# Patient Record
Sex: Male | Born: 1935 | Race: White | Hispanic: No | Smoking: Current some day smoker
Health system: Southern US, Community
[De-identification: ages and names within clinical notes are randomized; demographics above are authoritative.]

## PROBLEM LIST (undated history)

## (undated) DIAGNOSIS — I2510 Atherosclerotic heart disease of native coronary artery without angina pectoris: Secondary | ICD-10-CM

## (undated) DIAGNOSIS — F32A Depression, unspecified: Secondary | ICD-10-CM

## (undated) DIAGNOSIS — Z9289 Personal history of other medical treatment: Secondary | ICD-10-CM

## (undated) DIAGNOSIS — F4310 Post-traumatic stress disorder, unspecified: Secondary | ICD-10-CM

## (undated) DIAGNOSIS — D649 Anemia, unspecified: Secondary | ICD-10-CM

## (undated) DIAGNOSIS — I10 Essential (primary) hypertension: Secondary | ICD-10-CM

## (undated) DIAGNOSIS — M1990 Unspecified osteoarthritis, unspecified site: Secondary | ICD-10-CM

## (undated) DIAGNOSIS — F329 Major depressive disorder, single episode, unspecified: Secondary | ICD-10-CM

## (undated) DIAGNOSIS — G4733 Obstructive sleep apnea (adult) (pediatric): Secondary | ICD-10-CM

## (undated) DIAGNOSIS — K219 Gastro-esophageal reflux disease without esophagitis: Secondary | ICD-10-CM

## (undated) DIAGNOSIS — K649 Unspecified hemorrhoids: Secondary | ICD-10-CM

## (undated) DIAGNOSIS — I4891 Unspecified atrial fibrillation: Secondary | ICD-10-CM

## (undated) DIAGNOSIS — N183 Chronic kidney disease, stage 3 unspecified: Secondary | ICD-10-CM

## (undated) DIAGNOSIS — E7800 Pure hypercholesterolemia, unspecified: Secondary | ICD-10-CM

## (undated) DIAGNOSIS — Z9989 Dependence on other enabling machines and devices: Secondary | ICD-10-CM

## (undated) DIAGNOSIS — I319 Disease of pericardium, unspecified: Secondary | ICD-10-CM

## (undated) DIAGNOSIS — F419 Anxiety disorder, unspecified: Secondary | ICD-10-CM

## (undated) DIAGNOSIS — I4892 Unspecified atrial flutter: Secondary | ICD-10-CM

## (undated) DIAGNOSIS — F319 Bipolar disorder, unspecified: Secondary | ICD-10-CM

## (undated) DIAGNOSIS — E039 Hypothyroidism, unspecified: Secondary | ICD-10-CM

## (undated) DIAGNOSIS — Z8719 Personal history of other diseases of the digestive system: Secondary | ICD-10-CM

## (undated) HISTORY — DX: Depression, unspecified: F32.A

## (undated) HISTORY — DX: Atherosclerotic heart disease of native coronary artery without angina pectoris: I25.10

## (undated) HISTORY — DX: Major depressive disorder, single episode, unspecified: F32.9

## (undated) HISTORY — DX: Bipolar disorder, unspecified: F31.9

## (undated) HISTORY — PX: EYE SURGERY: SHX253

## (undated) HISTORY — PX: TOTAL KNEE ARTHROPLASTY: SHX125

## (undated) HISTORY — DX: Unspecified atrial fibrillation: I48.91

## (undated) HISTORY — PX: COLONOSCOPY: SHX174

## (undated) HISTORY — PX: CARDIAC CATHETERIZATION: SHX172

## (undated) HISTORY — DX: Unspecified osteoarthritis, unspecified site: M19.90

## (undated) HISTORY — DX: Hypothyroidism, unspecified: E03.9

## (undated) HISTORY — DX: Disease of pericardium, unspecified: I31.9

## (undated) HISTORY — DX: Essential (primary) hypertension: I10

## (undated) HISTORY — DX: Unspecified atrial flutter: I48.92

## (undated) HISTORY — DX: Gastro-esophageal reflux disease without esophagitis: K21.9

---

## 1989-05-21 HISTORY — PX: KNEE ARTHROSCOPY: SHX127

## 2009-09-09 ENCOUNTER — Encounter: Admission: RE | Admit: 2009-09-09 | Discharge: 2009-09-09 | Payer: Self-pay | Admitting: Nephrology

## 2009-12-15 ENCOUNTER — Inpatient Hospital Stay (HOSPITAL_COMMUNITY): Admission: RE | Admit: 2009-12-15 | Discharge: 2009-12-25 | Payer: Self-pay | Admitting: Orthopedic Surgery

## 2009-12-21 ENCOUNTER — Ambulatory Visit: Payer: Self-pay | Admitting: Hematology and Oncology

## 2009-12-30 ENCOUNTER — Ambulatory Visit: Payer: Self-pay | Admitting: Hematology and Oncology

## 2010-02-03 ENCOUNTER — Ambulatory Visit: Payer: Self-pay | Admitting: Hematology and Oncology

## 2010-02-04 LAB — PROTHROMBIN TIME: INR: 2.97 — ABNORMAL HIGH (ref <1.50)

## 2010-02-04 LAB — CBC WITH DIFFERENTIAL/PLATELET
BASO%: 0.6 % (ref 0.0–2.0)
HCT: 36.1 % — ABNORMAL LOW (ref 38.4–49.9)
MCV: 94.6 fL (ref 79.3–98.0)
NEUT#: 3.9 10*3/uL (ref 1.5–6.5)
NEUT%: 65.2 % (ref 39.0–75.0)
Platelets: 201 10*3/uL (ref 140–400)
WBC: 5.9 10*3/uL (ref 4.0–10.3)

## 2010-02-11 LAB — FACTOR 11 ASSAY: Factor XI Activity: 91 % (ref 65–150)

## 2010-02-11 LAB — FACTOR 9 ASSAY: Coagulation Factor IX: 31 % — ABNORMAL LOW (ref 75–134)

## 2010-02-11 LAB — LUPUS ANTICOAGULANT PANEL
DRVVT 1:1 Mix: 44.2 secs (ref 36.2–44.3)
DRVVT: 78.8 secs — ABNORMAL HIGH (ref 36.2–44.3)
PTT Lupus Anticoagulant: 80.4 secs — ABNORMAL HIGH (ref 32.0–43.4)
PTTLA 4:1 Mix: 50.6 secs — ABNORMAL HIGH (ref 36.3–48.8)

## 2010-02-11 LAB — FACTOR 8 INHIBITOR: Factor VIII: C, Activity: 266 % — ABNORMAL HIGH (ref 56–191)

## 2010-02-11 LAB — FACTOR 2 ASSAY: Factor II Activity: 22 % — ABNORMAL LOW (ref 74–131)

## 2010-12-09 LAB — CROSSMATCH

## 2010-12-09 LAB — HEPATIC FUNCTION PANEL
ALT: 42 U/L (ref 0–53)
AST: 50 U/L — ABNORMAL HIGH (ref 0–37)
Alkaline Phosphatase: 57 U/L (ref 39–117)
Bilirubin, Direct: 0.2 mg/dL (ref 0.0–0.3)

## 2010-12-09 LAB — BASIC METABOLIC PANEL
BUN: 31 mg/dL — ABNORMAL HIGH (ref 6–23)
BUN: 32 mg/dL — ABNORMAL HIGH (ref 6–23)
BUN: 38 mg/dL — ABNORMAL HIGH (ref 6–23)
CO2: 24 mEq/L (ref 19–32)
Calcium: 8.3 mg/dL — ABNORMAL LOW (ref 8.4–10.5)
Calcium: 8.7 mg/dL (ref 8.4–10.5)
Chloride: 107 mEq/L (ref 96–112)
Creatinine, Ser: 2.03 mg/dL — ABNORMAL HIGH (ref 0.4–1.5)
Creatinine, Ser: 2.31 mg/dL — ABNORMAL HIGH (ref 0.4–1.5)
GFR calc Af Amer: 28 mL/min — ABNORMAL LOW (ref >60)
GFR calc Af Amer: 33 mL/min — ABNORMAL LOW (ref >60)
GFR calc Af Amer: 37 mL/min — ABNORMAL LOW (ref >60)
GFR calc Af Amer: 37 mL/min — ABNORMAL LOW (ref >60)
GFR calc non Af Amer: 23 mL/min — ABNORMAL LOW (ref >60)
GFR calc non Af Amer: 30 mL/min — ABNORMAL LOW (ref >60)
GFR calc non Af Amer: 31 mL/min — ABNORMAL LOW (ref >60)
GFR calc non Af Amer: 32 mL/min — ABNORMAL LOW (ref >60)
Glucose, Bld: 100 mg/dL — ABNORMAL HIGH (ref 70–99)
Potassium: 4.8 mEq/L (ref 3.5–5.1)
Potassium: 4.8 mEq/L (ref 3.5–5.1)
Sodium: 138 mEq/L (ref 135–145)
Sodium: 138 mEq/L (ref 135–145)
Sodium: 144 mEq/L (ref 135–145)

## 2010-12-09 LAB — ANTIPHOSPHOLIPID SYNDROME EVAL, BLD
Anticardiolipin IgG: 2 GPL U/mL — ABNORMAL LOW (ref <23)
PTT Lupus Anticoagulant: 70.3 secs — ABNORMAL HIGH (ref 32.0–43.4)
PTTLA 4:1 Mix: 598.9 secs — ABNORMAL HIGH (ref 36.3–48.8)
PTTLA Confirmation: 21.3 secs — ABNORMAL HIGH (ref <8.0)

## 2010-12-09 LAB — CBC
HCT: 27.5 % — ABNORMAL LOW (ref 39.0–52.0)
HCT: 30 % — ABNORMAL LOW (ref 39.0–52.0)
HCT: 30.7 % — ABNORMAL LOW (ref 39.0–52.0)
Hemoglobin: 10.2 g/dL — ABNORMAL LOW (ref 13.0–17.0)
Hemoglobin: 8.1 g/dL — ABNORMAL LOW (ref 13.0–17.0)
Hemoglobin: 9.6 g/dL — ABNORMAL LOW (ref 13.0–17.0)
MCHC: 33.4 g/dL (ref 30.0–36.0)
MCHC: 33.7 g/dL (ref 30.0–36.0)
MCV: 94.3 fL (ref 78.0–100.0)
MCV: 95.8 fL (ref 78.0–100.0)
MCV: 96.3 fL (ref 78.0–100.0)
Platelets: 166 10*3/uL (ref 150–400)
Platelets: 196 10*3/uL (ref 150–400)
Platelets: 264 10*3/uL (ref 150–400)
RBC: 2.49 MIL/uL — ABNORMAL LOW (ref 4.22–5.81)
RBC: 2.62 MIL/uL — ABNORMAL LOW (ref 4.22–5.81)
RBC: 2.85 MIL/uL — ABNORMAL LOW (ref 4.22–5.81)
RBC: 3.06 MIL/uL — ABNORMAL LOW (ref 4.22–5.81)
RBC: 3.2 MIL/uL — ABNORMAL LOW (ref 4.22–5.81)
RDW: 14.8 % (ref 11.5–15.5)
RDW: 14.9 % (ref 11.5–15.5)
RDW: 15.4 % (ref 11.5–15.5)
RDW: 15.8 % — ABNORMAL HIGH (ref 11.5–15.5)
WBC: 7.9 10*3/uL (ref 4.0–10.5)
WBC: 8.3 10*3/uL (ref 4.0–10.5)
WBC: 9.3 10*3/uL (ref 4.0–10.5)
WBC: 9.5 10*3/uL (ref 4.0–10.5)

## 2010-12-09 LAB — URINE CULTURE

## 2010-12-09 LAB — URINALYSIS, MICROSCOPIC ONLY
Bilirubin Urine: NEGATIVE
Specific Gravity, Urine: 1.008 (ref 1.005–1.030)
pH: 8 (ref 5.0–8.0)

## 2010-12-09 LAB — PROTIME-INR
INR: 1.2 (ref 0.00–1.49)
INR: 1.22 (ref 0.00–1.49)
INR: 1.29 (ref 0.00–1.49)
INR: 1.34 (ref 0.00–1.49)
INR: 2.16 — ABNORMAL HIGH (ref 0.00–1.49)
INR: 2.18 — ABNORMAL HIGH (ref 0.00–1.49)
Prothrombin Time: 16 seconds — ABNORMAL HIGH (ref 11.6–15.2)
Prothrombin Time: 22.4 seconds — ABNORMAL HIGH (ref 11.6–15.2)
Prothrombin Time: 23.9 seconds — ABNORMAL HIGH (ref 11.6–15.2)

## 2010-12-09 LAB — PREPARE FRESH FROZEN PLASMA

## 2010-12-09 LAB — VON WILLEBRAND PANEL
Factor-VIII Activity: 165 % — ABNORMAL HIGH (ref 50–150)
Ristocetin-Cofactor: >150 % (ref 50–150)

## 2010-12-09 LAB — APTT
aPTT: 46 seconds — ABNORMAL HIGH (ref 24–37)
aPTT: 66 seconds — ABNORMAL HIGH (ref 24–37)

## 2010-12-09 LAB — THROMBIN TIME: Thrombin Time: 17 (ref 16–23)

## 2010-12-09 LAB — PT FACTOR INHIBITOR (MIXING STUDY)
Patient-1/1, Immediate Mix-PT: NORMAL seconds
Protime: 14.5 seconds (ref 11.6–15.2)

## 2010-12-09 LAB — PTT FACTOR INHIBITOR (MIXING STUDY): PTT: 42 seconds — ABNORMAL HIGH (ref 24–37)

## 2010-12-09 LAB — FACTOR 11 ASSAY
Factor XI Activity: 104 % (ref 65–150)
Factor XI Activity: 88 % (ref 65–150)

## 2010-12-09 LAB — FIBRINOGEN: Fibrinogen: >800 mg/dL — ABNORMAL HIGH (ref 204–475)

## 2010-12-14 LAB — CBC
HCT: 27.5 % — ABNORMAL LOW (ref 39.0–52.0)
HCT: 32 % — ABNORMAL LOW (ref 39.0–52.0)
Hemoglobin: 10.7 g/dL — ABNORMAL LOW (ref 13.0–17.0)
Hemoglobin: 8 g/dL — ABNORMAL LOW (ref 13.0–17.0)
Hemoglobin: 9.1 g/dL — ABNORMAL LOW (ref 13.0–17.0)
MCHC: 33 g/dL (ref 30.0–36.0)
MCHC: 33.1 g/dL (ref 30.0–36.0)
MCHC: 33.4 g/dL (ref 30.0–36.0)
MCHC: 33.5 g/dL (ref 30.0–36.0)
MCV: 96.7 fL (ref 78.0–100.0)
MCV: 97.1 fL (ref 78.0–100.0)
MCV: 97.8 fL (ref 78.0–100.0)
Platelets: 143 10*3/uL — ABNORMAL LOW (ref 150–400)
Platelets: 157 10*3/uL (ref 150–400)
Platelets: 192 10*3/uL (ref 150–400)
RBC: 3.31 MIL/uL — ABNORMAL LOW (ref 4.22–5.81)
RBC: 3.66 MIL/uL — ABNORMAL LOW (ref 4.22–5.81)
RDW: 13.5 % (ref 11.5–15.5)
RDW: 13.9 % (ref 11.5–15.5)
RDW: 14.3 % (ref 11.5–15.5)
WBC: 9.6 10*3/uL (ref 4.0–10.5)

## 2010-12-14 LAB — COMPREHENSIVE METABOLIC PANEL
AST: 22 U/L (ref 0–37)
BUN: 39 mg/dL — ABNORMAL HIGH (ref 6–23)
CO2: 23 mEq/L (ref 19–32)
Calcium: 8.2 mg/dL — ABNORMAL LOW (ref 8.4–10.5)
Creatinine, Ser: 2.67 mg/dL — ABNORMAL HIGH (ref 0.4–1.5)
GFR calc Af Amer: 28 mL/min — ABNORMAL LOW (ref >60)
GFR calc non Af Amer: 23 mL/min — ABNORMAL LOW (ref >60)
Glucose, Bld: 85 mg/dL (ref 70–99)

## 2010-12-14 LAB — BASIC METABOLIC PANEL
BUN: 35 mg/dL — ABNORMAL HIGH (ref 6–23)
BUN: 37 mg/dL — ABNORMAL HIGH (ref 6–23)
BUN: 38 mg/dL — ABNORMAL HIGH (ref 6–23)
BUN: 38 mg/dL — ABNORMAL HIGH (ref 6–23)
BUN: 39 mg/dL — ABNORMAL HIGH (ref 6–23)
CO2: 25 mEq/L (ref 19–32)
CO2: 25 mEq/L (ref 19–32)
CO2: 25 mEq/L (ref 19–32)
CO2: 26 mEq/L (ref 19–32)
CO2: 27 mEq/L (ref 19–32)
CO2: 27 mEq/L (ref 19–32)
Calcium: 7.6 mg/dL — ABNORMAL LOW (ref 8.4–10.5)
Calcium: 7.7 mg/dL — ABNORMAL LOW (ref 8.4–10.5)
Calcium: 7.9 mg/dL — ABNORMAL LOW (ref 8.4–10.5)
Calcium: 8.1 mg/dL — ABNORMAL LOW (ref 8.4–10.5)
Chloride: 102 mEq/L (ref 96–112)
Chloride: 105 mEq/L (ref 96–112)
Chloride: 107 mEq/L (ref 96–112)
Chloride: 107 mEq/L (ref 96–112)
Chloride: 108 mEq/L (ref 96–112)
Creatinine, Ser: 2.78 mg/dL — ABNORMAL HIGH (ref 0.4–1.5)
Creatinine, Ser: 2.82 mg/dL — ABNORMAL HIGH (ref 0.4–1.5)
Creatinine, Ser: 3.2 mg/dL — ABNORMAL HIGH (ref 0.4–1.5)
Creatinine, Ser: 3.28 mg/dL — ABNORMAL HIGH (ref 0.4–1.5)
GFR calc Af Amer: 22 mL/min — ABNORMAL LOW (ref >60)
GFR calc Af Amer: 23 mL/min — ABNORMAL LOW (ref >60)
GFR calc Af Amer: 27 mL/min — ABNORMAL LOW (ref >60)
GFR calc non Af Amer: 18 mL/min — ABNORMAL LOW (ref >60)
GFR calc non Af Amer: 19 mL/min — ABNORMAL LOW (ref >60)
GFR calc non Af Amer: 22 mL/min — ABNORMAL LOW (ref >60)
GFR calc non Af Amer: 22 mL/min — ABNORMAL LOW (ref >60)
Glucose, Bld: 108 mg/dL — ABNORMAL HIGH (ref 70–99)
Glucose, Bld: 117 mg/dL — ABNORMAL HIGH (ref 70–99)
Glucose, Bld: 124 mg/dL — ABNORMAL HIGH (ref 70–99)
Glucose, Bld: 127 mg/dL — ABNORMAL HIGH (ref 70–99)
Glucose, Bld: 130 mg/dL — ABNORMAL HIGH (ref 70–99)
Glucose, Bld: 99 mg/dL (ref 70–99)
Potassium: 4.4 mEq/L (ref 3.5–5.1)
Potassium: 4.7 mEq/L (ref 3.5–5.1)
Potassium: 5 mEq/L (ref 3.5–5.1)
Potassium: 6.1 mEq/L — ABNORMAL HIGH (ref 3.5–5.1)
Potassium: 6.7 mEq/L (ref 3.5–5.1)
Sodium: 131 mEq/L — ABNORMAL LOW (ref 135–145)
Sodium: 136 mEq/L (ref 135–145)
Sodium: 137 mEq/L (ref 135–145)
Sodium: 137 mEq/L (ref 135–145)
Sodium: 138 mEq/L (ref 135–145)

## 2010-12-14 LAB — CROSSMATCH
ABO/RH(D): A POS
Antibody Screen: NEGATIVE

## 2010-12-14 LAB — URINALYSIS, ROUTINE W REFLEX MICROSCOPIC
Bilirubin Urine: NEGATIVE
Hgb urine dipstick: NEGATIVE
Specific Gravity, Urine: 1.01 (ref 1.005–1.030)
Urobilinogen, UA: 0.2 mg/dL (ref 0.0–1.0)

## 2010-12-14 LAB — PROTIME-INR
INR: 1.23 (ref 0.00–1.49)
INR: 1.61 — ABNORMAL HIGH (ref 0.00–1.49)
INR: 1.86 — ABNORMAL HIGH (ref 0.00–1.49)
INR: 3.09 — ABNORMAL HIGH (ref 0.00–1.49)
Prothrombin Time: 15.4 seconds — ABNORMAL HIGH (ref 11.6–15.2)
Prothrombin Time: 19 seconds — ABNORMAL HIGH (ref 11.6–15.2)
Prothrombin Time: 31.6 seconds — ABNORMAL HIGH (ref 11.6–15.2)

## 2010-12-14 LAB — APTT
aPTT: 28 seconds (ref 24–37)
aPTT: 58 seconds — ABNORMAL HIGH (ref 24–37)

## 2010-12-14 LAB — ABO/RH: ABO/RH(D): A POS

## 2011-08-10 ENCOUNTER — Encounter: Payer: Self-pay | Admitting: Internal Medicine

## 2011-08-27 ENCOUNTER — Encounter: Payer: Self-pay | Admitting: Internal Medicine

## 2011-09-20 ENCOUNTER — Encounter: Payer: Self-pay | Admitting: Internal Medicine

## 2011-09-20 ENCOUNTER — Telehealth: Payer: Self-pay | Admitting: Physician Assistant

## 2011-09-20 ENCOUNTER — Ambulatory Visit (INDEPENDENT_AMBULATORY_CARE_PROVIDER_SITE_OTHER): Payer: Medicare Other | Admitting: Internal Medicine

## 2011-09-20 ENCOUNTER — Encounter: Payer: Self-pay | Admitting: *Deleted

## 2011-09-20 VITALS — BP 128/86 | HR 106 | Ht 71.5 in | Wt 210.1 lb

## 2011-09-20 DIAGNOSIS — I4891 Unspecified atrial fibrillation: Secondary | ICD-10-CM

## 2011-09-20 DIAGNOSIS — I4892 Unspecified atrial flutter: Secondary | ICD-10-CM

## 2011-09-20 DIAGNOSIS — I10 Essential (primary) hypertension: Secondary | ICD-10-CM

## 2011-09-20 LAB — CBC WITH DIFFERENTIAL/PLATELET
Basophils Absolute: 0 10*3/uL (ref 0.0–0.1)
Basophils Relative: 1 % (ref 0–1)
Hemoglobin: 11.8 g/dL — ABNORMAL LOW (ref 13.0–17.0)
MCHC: 32 g/dL (ref 30.0–36.0)
Monocytes Relative: 9 % (ref 3–12)
Neutro Abs: 3.7 10*3/uL (ref 1.7–7.7)
Neutrophils Relative %: 60 % (ref 43–77)

## 2011-09-20 LAB — BASIC METABOLIC PANEL WITH GFR
BUN: 55 mg/dL — ABNORMAL HIGH (ref 6–23)
CO2: 25 meq/L (ref 19–32)
Calcium: 9.2 mg/dL (ref 8.4–10.5)
Chloride: 103 meq/L (ref 96–112)
Creat: 2.91 mg/dL — ABNORMAL HIGH (ref 0.50–1.35)
Glucose, Bld: 90 mg/dL (ref 70–99)
Potassium: 4.8 meq/L (ref 3.5–5.3)
Sodium: 137 meq/L (ref 135–145)

## 2011-09-20 LAB — PROTIME-INR
INR: 2.62 — ABNORMAL HIGH (ref <1.50)
Prothrombin Time: 28.9 s — ABNORMAL HIGH (ref 11.6–15.2)

## 2011-09-20 NOTE — Progress Notes (Signed)
Primary Care Physician: Pearla Dubonnet, MD, MD Referring Physician:  Dr Kevan Ny Dibella is a 77 y.o. male with a h/o atrial fibrillation and atrial flutter who presents today for EP consultation.  He reports that he was initially diagnosed with atrial fibrillation 2007.  He does not recall the specifics of his symptoms at that time.  He required cardioversion at that time.  Since that time, he notices episodes of afib when having episodes of GERD.  He was initiated on propafenone 4-5 years ago.  He is not certain as to what his afib burden was on propafenone.  He has noticed that over the past few months, he has had decreased exercise tolerance.  He ws evaluated by Dr Abe People and found to have atrial flutter with RVR.  He presently feels that he has "good days and bad days".  Upon being evaluated by Dr Eldridge Dace, His Coreg was increased at that time.  He has been adequately anticoagulated with coumadin.  Today, he denies symptoms of palpitations, chest pain, shortness of breath, orthopnea, PND, lower extremity edema, dizziness, presyncope, syncope, or neurologic sequela. The patient is tolerating medications without difficulties and is otherwise without complaint today.   Past Medical History  Diagnosis Date  . Campath-induced atrial fibrillation   . Atrial flutter   . Chronic renal failure   . GERD (gastroesophageal reflux disease)   . Hypertension   . Hypothyroidism   . DJD (degenerative joint disease)     s/p L TKR 12/15/09  . Hyperparathyroidism   . Bipolar disorder   . Obstructive sleep apnea     compliant with CPAP  . Coronary artery disease     mild, 60% stenosis of prox LAD per CTA 2007  . Depression   . Pericarditis     age 54   Past Surgical History  Procedure Date  . Bilateral total knee arthoplasty     Current Outpatient Prescriptions  Medication Sig Dispense Refill  . amLODipine (NORVASC) 5 MG tablet Take 5 mg by mouth daily.        . calcitRIOL (ROCALTROL)  0.25 MCG capsule Take 0.25 mcg by mouth daily.        . carvedilol (COREG) 12.5 MG tablet Take 25 mg by mouth 2 (two) times daily with a meal.        . fish oil-omega-3 fatty acids 1000 MG capsule Take 1 g by mouth daily.        . furosemide (LASIX) 40 MG tablet Take 40 mg by mouth daily.        Marland Kitchen lamoTRIgine (LAMICTAL) 200 MG tablet Take 200 mg by mouth 2 (two) times daily.        Marland Kitchen levothyroxine (SYNTHROID, LEVOTHROID) 112 MCG tablet Take 112 mcg by mouth daily.        Marland Kitchen omeprazole (PRILOSEC) 20 MG capsule Take 20 mg by mouth daily.        . propafenone (RYTHMOL) 225 MG tablet Take 225 mg by mouth 2 (two) times daily.        . simvastatin (ZOCOR) 20 MG tablet Take 20 mg by mouth at bedtime.        . tadalafil (CIALIS) 5 MG tablet Take 5 mg by mouth daily as needed.        . warfarin (COUMADIN) 5 MG tablet Take 5 mg by mouth daily.          No Known Allergies  History   Social History  . Marital Status:  Single    Spouse Name: N/A    Number of Children: N/A  . Years of Education: N/A   Occupational History  . Not on file.   Social History Main Topics  . Smoking status: Former Games developer  . Smokeless tobacco: Not on file   Comment: quit x 25 years  . Alcohol Use: Yes     1-2 glasses of red wine several times per week  . Drug Use: No  . Sexually Active: Not on file   Other Topics Concern  . Not on file   Social History Narrative   Pt lives in [place].  Retired.  Previously worked as a Warden/ranger    FH: adopted  ROS- All systems are reviewed and negative except as per the HPI above  Physical Exam: Filed Vitals:   09/20/11 1222  BP: 128/86  Pulse: 106  Height: 5' 11.5" (1.816 m)  Weight: 210 lb 1.9 oz (95.31 kg)    GEN- The patient is well appearing, alert and oriented x 3 today.   Head- normocephalic, atraumatic Eyes-  Sclera clear, conjunctiva pink Ears- hearing intact Oropharynx- clear Neck- supple, no JVP Lymph- no cervical  lymphadenopathy Lungs- Clear to ausculation bilaterally, normal work of breathing Heart- tachycadic irregular rhythm, no murmurs, rubs or gallops, PMI not laterally displaced GI- soft, NT, ND, + BS Extremities- no clubbing, cyanosis, or edema MS- no significant deformity or atrophy Skin- no rash or lesion Psych- euthymic mood, full affect Neuro- strength and sensation are intact  EKG today reveals atrial flutter (likely typical) with RBBB, LAHB QRS morphology  Assessment and Plan:

## 2011-09-20 NOTE — Telephone Encounter (Signed)
Stat labs from this evening called in at 8:30pm. Cr slightly higher than usual but has varied 2.03-2.8 over the last several results-- will forward this message to MD to ensure they are reviewed.

## 2011-09-20 NOTE — Patient Instructions (Signed)
Your physician has recommended that you have an ablation. Catheter ablation is a medical procedure used to treat some cardiac arrhythmias (irregular heartbeats). During catheter ablation, a long, thin, flexible tube is put into a blood vessel in your groin (upper thigh), or neck. This tube is called an ablation catheter. It is then guided to your heart through the blood vessel. Radio frequency waves destroy small areas of heart tissue where abnormal heartbeats may cause an arrhythmia to start. Please see the instruction sheet given to you today.     Your physician has requested that you have an echocardiogram. Echocardiography is a painless test that uses sound waves to create images of your heart. It provides your doctor with information about the size and shape of your heart and how well your heart's chambers and valves are working. This procedure takes approximately one hour. There are no restrictions for this procedure.---At  Dr Hoyle Barr office

## 2011-09-21 DIAGNOSIS — I10 Essential (primary) hypertension: Secondary | ICD-10-CM | POA: Insufficient documentation

## 2011-09-21 DIAGNOSIS — I4891 Unspecified atrial fibrillation: Secondary | ICD-10-CM | POA: Insufficient documentation

## 2011-09-21 DIAGNOSIS — I4892 Unspecified atrial flutter: Secondary | ICD-10-CM | POA: Insufficient documentation

## 2011-09-21 NOTE — Assessment & Plan Note (Signed)
The patient presents today with symptomatic atrial flutter.  He has 2:1 AV conduction with underlying RBBB/LAHB QRS pattern. Therapeutic strategies for this tachycardia including medicine and ablation were discussed in detail with the patient today. Risk, benefits, and alternatives to EP study and radiofrequency ablation were also discussed in detail today. These risks include but are not limited to stroke, bleeding, vascular damage, tamponade, perforation, damage to the heart and other structures, AV block requiring pacemaker, worsening renal function, and death. The patient understands these risk and wishes to proceed.  We will therefore proceed with catheter ablation at the next available time.  He is chronically anticoagulated with coumadin and has been recently therapeutic. No changes are made today.

## 2011-09-21 NOTE — Assessment & Plan Note (Signed)
Stable No change required today  

## 2011-09-21 NOTE — Assessment & Plan Note (Signed)
The patient has afib and atrial flutter (see above).  He is presently symptomatic with persistent atrial flutter.  He feels that his afib has been controlled with Rhythmol in the past.  Given his renal failure, I would not advise afib ablation at this time (which would require contrast).  I would favor atrial flutter ablation as above (if right atrial flutter exists).  We will manage atrial fibrillation with AAD long term. I will ask Dr Eldridge Dace to obtain an echo to evaluate left atrial size and exclude structural heart disease. He will require life long anticoagulation with coumadin.  Given long standing renal failure, he would not be a candidate for a novel anticoagulation at this time.

## 2011-09-22 ENCOUNTER — Encounter (HOSPITAL_COMMUNITY): Payer: Self-pay | Admitting: Pharmacy Technician

## 2011-09-28 ENCOUNTER — Ambulatory Visit (HOSPITAL_COMMUNITY): Admit: 2011-09-28 | Payer: Medicare Other | Admitting: Internal Medicine

## 2011-09-28 ENCOUNTER — Encounter (HOSPITAL_COMMUNITY): Payer: Self-pay

## 2011-09-28 ENCOUNTER — Ambulatory Visit (HOSPITAL_COMMUNITY)
Admission: RE | Admit: 2011-09-28 | Discharge: 2011-09-28 | Disposition: A | Discharge status: Home / Self Care | Payer: Medicare Other | Source: Ambulatory Visit | Attending: Internal Medicine | Admitting: Internal Medicine

## 2011-09-28 ENCOUNTER — Encounter (HOSPITAL_COMMUNITY): Payer: Self-pay | Admitting: *Deleted

## 2011-09-28 ENCOUNTER — Encounter (HOSPITAL_COMMUNITY)
Admission: RE | Disposition: A | Discharge status: Home / Self Care | Payer: Self-pay | Source: Ambulatory Visit | Attending: Internal Medicine

## 2011-09-28 ENCOUNTER — Ambulatory Visit (HOSPITAL_COMMUNITY): Payer: Medicare Other

## 2011-09-28 DIAGNOSIS — F319 Bipolar disorder, unspecified: Secondary | ICD-10-CM | POA: Insufficient documentation

## 2011-09-28 DIAGNOSIS — I2510 Atherosclerotic heart disease of native coronary artery without angina pectoris: Secondary | ICD-10-CM | POA: Insufficient documentation

## 2011-09-28 DIAGNOSIS — I129 Hypertensive chronic kidney disease with stage 1 through stage 4 chronic kidney disease, or unspecified chronic kidney disease: Secondary | ICD-10-CM | POA: Insufficient documentation

## 2011-09-28 DIAGNOSIS — I4892 Unspecified atrial flutter: Secondary | ICD-10-CM

## 2011-09-28 DIAGNOSIS — Z96659 Presence of unspecified artificial knee joint: Secondary | ICD-10-CM | POA: Insufficient documentation

## 2011-09-28 DIAGNOSIS — N189 Chronic kidney disease, unspecified: Secondary | ICD-10-CM | POA: Insufficient documentation

## 2011-09-28 DIAGNOSIS — E039 Hypothyroidism, unspecified: Secondary | ICD-10-CM | POA: Insufficient documentation

## 2011-09-28 DIAGNOSIS — K219 Gastro-esophageal reflux disease without esophagitis: Secondary | ICD-10-CM | POA: Insufficient documentation

## 2011-09-28 DIAGNOSIS — M1990 Unspecified osteoarthritis, unspecified site: Secondary | ICD-10-CM | POA: Insufficient documentation

## 2011-09-28 HISTORY — PX: OTHER SURGICAL HISTORY: SHX169

## 2011-09-28 HISTORY — PX: ATRIAL FLUTTER ABLATION: SHX5733

## 2011-09-28 LAB — PROTIME-INR
INR: 2.9 — ABNORMAL HIGH (ref 0.00–1.49)
Prothrombin Time: 30.8 seconds — ABNORMAL HIGH (ref 11.6–15.2)

## 2011-09-28 SURGERY — ATRIAL FLUTTER ABLATION
Anesthesia: General

## 2011-09-28 MED ORDER — ONDANSETRON HCL 4 MG/2ML IJ SOLN
INTRAMUSCULAR | Status: DC | PRN
  Administered 2011-09-28: 4 mg via INTRAVENOUS

## 2011-09-28 MED ORDER — PROPOFOL 10 MG/ML IV EMUL
INTRAVENOUS | Status: DC | PRN
  Administered 2011-09-28: 25 ug/kg/min via INTRAVENOUS

## 2011-09-28 MED ORDER — BUPIVACAINE HCL (PF) 0.25 % IJ SOLN
INTRAMUSCULAR | Status: AC
  Filled 2011-09-28: qty 30

## 2011-09-28 MED ORDER — SODIUM CHLORIDE 0.9 % IV SOLN: INTRAVENOUS | Status: DC

## 2011-09-28 MED ORDER — SODIUM CHLORIDE 0.9 % IJ SOLN: 3.0000 mL | Freq: Two times a day (BID) | INTRAMUSCULAR | Status: DC

## 2011-09-28 MED ORDER — LACTATED RINGERS IV SOLN
INTRAVENOUS | Status: DC | PRN
  Administered 2011-09-28: 07:00:00 via INTRAVENOUS

## 2011-09-28 MED ORDER — FENTANYL CITRATE 0.05 MG/ML IJ SOLN
INTRAMUSCULAR | Status: DC | PRN
  Administered 2011-09-28: 50 ug via INTRAVENOUS
  Administered 2011-09-28: 25 ug via INTRAVENOUS
  Administered 2011-09-28: 50 ug via INTRAVENOUS
  Administered 2011-09-28: 25 ug via INTRAVENOUS

## 2011-09-28 MED ORDER — SODIUM CHLORIDE 0.9 % IV SOLN: 250.0000 mL | INTRAVENOUS | Status: DC | PRN

## 2011-09-28 MED ORDER — MIDAZOLAM HCL 5 MG/5ML IJ SOLN
INTRAMUSCULAR | Status: DC | PRN
  Administered 2011-09-28: 2 mg via INTRAVENOUS

## 2011-09-28 MED ORDER — SODIUM CHLORIDE 0.9 % IJ SOLN: 3.0000 mL | INTRAMUSCULAR | Status: DC | PRN

## 2011-09-28 NOTE — Interval H&P Note (Signed)
History and Physical Interval Note:  09/28/2011 7:18 AM  Tim Ponce  has presented today for surgery, with the diagnosis of A Flutter  The various methods of treatment have been discussed with the patient and family. After consideration of risks, benefits and other options for treatment, the patient has consented to  Procedure(s): ATRIAL FLUTTER ABLATION as a surgical intervention .  The patients' history has been reviewed, patient examined, no change in status, stable for surgery.  I have reviewed the patients' chart and labs.  Questions were answered to the patient's satisfaction.     [name]

## 2011-09-28 NOTE — Anesthesia Postprocedure Evaluation (Signed)
  Anesthesia Post-op Note  Patient: Tim Ponce  Procedure(s) Performed:  ATRIAL FLUTTER ABLATION  Patient Location: Cath Lab  Anesthesia Type: MAC  Level of Consciousness: awake, oriented, sedated and patient cooperative  Airway and Oxygen Therapy: Patient Spontanous Breathing and Patient connected to nasal cannula oxygen  Post-op Pain: none  Post-op Assessment: Post-op Vital signs reviewed, Patient's Cardiovascular Status Stable, Respiratory Function Stable, Patent Airway, No signs of Nausea or vomiting and Pain level controlled  Post-op Vital Signs: stable  Complications: No apparent anesthesia complications

## 2011-09-28 NOTE — Anesthesia Preprocedure Evaluation (Addendum)
Anesthesia Evaluation  Patient identified by MRN, date of birth, ID band Patient awake    Reviewed: Allergy & Precautions, H&P , NPO status , Patient's Chart, lab work & pertinent test results, reviewed documented beta blocker date and time   History of Anesthesia Complications Negative for: history of anesthetic complications  Airway Mallampati: I TM Distance: >3 FB Neck ROM: Full    Dental  (+) Upper Dentures and Dental Advisory Given   Pulmonary sleep apnea (good results with CPAP) and Continuous Positive Airway Pressure Ventilation , former smoker (quit 30 years)   Pulmonary exam normal       Cardiovascular hypertension, Pt. on medications and Pt. on home beta blockers + CAD (60% LAD) + dysrhythmias (INR 2.90 today) Atrial Fibrillation Irregular Normal ECHO mild septal aneurysm, mild Ao root dilation, EF 45-50%   Neuro/Psych PSYCHIATRIC DISORDERS Depression Bipolar Disorder    GI/Hepatic Neg liver ROS, GERD-  Medicated and Controlled,  Endo/Other  Hypothyroidism (on meds)   Renal/GU Renal InsufficiencyRenal disease (creat 2.6)Baseline creatinine 2.6     Musculoskeletal   Abdominal   Peds  Hematology   Anesthesia Other Findings   Reproductive/Obstetrics                          Anesthesia Physical Anesthesia Plan  ASA: III  Anesthesia Plan: MAC   Post-op Pain Management:    Induction: Intravenous  Airway Management Planned: Simple Face Mask  Additional Equipment: Arterial line  Intra-op Plan:   Post-operative Plan:   Informed Consent: I have reviewed the patients History and Physical, chart, labs and discussed the procedure including the risks, benefits and alternatives for the proposed anesthesia with the patient or authorized representative who has indicated his/her understanding and acceptance.   Dental advisory given  Plan Discussed with: CRNA, Anesthesiologist and  Surgeon  Anesthesia Plan Comments: (Plan routine monitors, MAC with a-line from EP personnel)       Anesthesia Quick Evaluation

## 2011-09-28 NOTE — H&P (View-Only) (Signed)
Primary Care Physician: GATES,ROBERT NEVILL, MD, MD Referring Physician:  Dr Varanasi   Tim Ponce is a 78 y.o. male with a h/o atrial fibrillation and atrial flutter who presents today for EP consultation.  He reports that he was initially diagnosed with atrial fibrillation 2007.  He does not recall the specifics of his symptoms at that time.  He required cardioversion at that time.  Since that time, he notices episodes of afib when having episodes of GERD.  He was initiated on propafenone 4-5 years ago.  He is not certain as to what his afib burden was on propafenone.  He has noticed that over the past few months, he has had decreased exercise tolerance.  He ws evaluated by Dr Varansi and found to have atrial flutter with RVR.  He presently feels that he has "good days and bad days".  Upon being evaluated by Dr Varanasi, His Coreg was increased at that time.  He has been adequately anticoagulated with coumadin.  Today, he denies symptoms of palpitations, chest pain, shortness of breath, orthopnea, PND, lower extremity edema, dizziness, presyncope, syncope, or neurologic sequela. The patient is tolerating medications without difficulties and is otherwise without complaint today.   Past Medical History  Diagnosis Date  . Campath-induced atrial fibrillation   . Atrial flutter   . Chronic renal failure   . GERD (gastroesophageal reflux disease)   . Hypertension   . Hypothyroidism   . DJD (degenerative joint disease)     s/p L TKR 12/15/09  . Hyperparathyroidism   . Bipolar disorder   . Obstructive sleep apnea     compliant with CPAP  . Coronary artery disease     mild, 60% stenosis of prox LAD per CTA 2007  . Depression   . Pericarditis     age 32   Past Surgical History  Procedure Date  . Bilateral total knee arthoplasty     Current Outpatient Prescriptions  Medication Sig Dispense Refill  . amLODipine (NORVASC) 5 MG tablet Take 5 mg by mouth daily.        . calcitRIOL (ROCALTROL)  0.25 MCG capsule Take 0.25 mcg by mouth daily.        . carvedilol (COREG) 12.5 MG tablet Take 25 mg by mouth 2 (two) times daily with a meal.        . fish oil-omega-3 fatty acids 1000 MG capsule Take 1 g by mouth daily.        . furosemide (LASIX) 40 MG tablet Take 40 mg by mouth daily.        . lamoTRIgine (LAMICTAL) 200 MG tablet Take 200 mg by mouth 2 (two) times daily.        . levothyroxine (SYNTHROID, LEVOTHROID) 112 MCG tablet Take 112 mcg by mouth daily.        . omeprazole (PRILOSEC) 20 MG capsule Take 20 mg by mouth daily.        . propafenone (RYTHMOL) 225 MG tablet Take 225 mg by mouth 2 (two) times daily.        . simvastatin (ZOCOR) 20 MG tablet Take 20 mg by mouth at bedtime.        . tadalafil (CIALIS) 5 MG tablet Take 5 mg by mouth daily as needed.        . warfarin (COUMADIN) 5 MG tablet Take 5 mg by mouth daily.          No Known Allergies  History   Social History  . Marital Status:   Single    Spouse Name: N/A    Number of Children: N/A  . Years of Education: N/A   Occupational History  . Not on file.   Social History Main Topics  . Smoking status: Former Smoker  . Smokeless tobacco: Not on file   Comment: quit x 25 years  . Alcohol Use: Yes     1-2 glasses of red wine several times per week  . Drug Use: No  . Sexually Active: Not on file   Other Topics Concern  . Not on file   Social History Narrative   Pt lives in [place].  Retired.  Previously worked as a Manufacturers representative    FH: adopted  ROS- All systems are reviewed and negative except as per the HPI above  Physical Exam: Filed Vitals:   09/20/11 1222  BP: 128/86  Pulse: 106  Height: 5' 11.5" (1.816 m)  Weight: 210 lb 1.9 oz (95.31 kg)    GEN- The patient is well appearing, alert and oriented x 3 today.   Head- normocephalic, atraumatic Eyes-  Sclera clear, conjunctiva pink Ears- hearing intact Oropharynx- clear Neck- supple, no JVP Lymph- no cervical  lymphadenopathy Lungs- Clear to ausculation bilaterally, normal work of breathing Heart- tachycadic irregular rhythm, no murmurs, rubs or gallops, PMI not laterally displaced GI- soft, NT, ND, + BS Extremities- no clubbing, cyanosis, or edema MS- no significant deformity or atrophy Skin- no rash or lesion Psych- euthymic mood, full affect Neuro- strength and sensation are intact  EKG today reveals atrial flutter (likely typical) with RBBB, LAHB QRS morphology  Assessment and Plan:  

## 2011-09-28 NOTE — Op Note (Signed)
NAME:  Tim Ponce, Tim Ponce NO.:  [id number]  MEDICAL RECORD NO.:  [id number]  LOCATION:  [facility]                         FACILITY:  [facility]  PHYSICIAN:  [name], MD       DATE OF BIRTH:  [birth year]  DATE OF PROCEDURE: DATE OF DISCHARGE:  09/28/2011                              OPERATIVE REPORT   SURGEON:  [name], MD  PREPROCEDURE DIAGNOSIS:  Atrial flutter.  POSTPROCEDURE DIAGNOSIS:  Isthmus-dependent right atrial flutter.  PROCEDURES: 1. Comprehensive EP study. 2. Coronary sinus pacing and recording. 3. Mapping of atrial flutter. 4. Ablation of atrial flutter.  INTRODUCTION:  Tim Ponce is a pleasant gentleman with a history of symptomatic typical-appearing atrial flutter as well as atrial fibrillation.  Recently, his atrial arrhythmias have been predominantly typical-appearing atrial flutter.  He has failed medical therapy with Rythmol.  He reports symptoms of fatigue and tachy palpitations.  He therefore presents today for EP study and radiofrequency ablation of his atrial flutter.  DESCRIPTION OF THE PROCEDURE:  Informed written consent was obtained, and the patient was brought to the electrophysiology lab in the fasting state.  He was adequately sedated with intravenous medications as outlined in the anesthesia report.  The patient's right groin was prepped and draped in the usual sterile fashion by the EP lab staff. Using a percutaneous Seldinger technique, two 7-French and one 8-French hemostasis sheaths were placed into the right common femoral vein.  A 7- Marketing executive coronary sinus catheter was introduced through the right common femoral vein and advanced into the coronary sinus for recording and pacing from this location.  A 6-French quadripolar Josephson catheter was introduced through the right common femoral vein and advanced into the right ventricle for recording and pacing.  This catheter was then pulled back to  the His bundle location. The patient presented to the electrophysiology lab in atrial flutter. The atrial flutter cycle length was 320 msec.  The surface electrogram was consistent with typical atrial flutter.  The patient did have a right bundle-branch left anterior hemiblock QRS pattern.  The QRS duration measured 196 msec with a QT interval of 390 msec.  The coronary sinus activation was proximal to distal in consistent with right atrial flutter.  Entrainment was performed from the left atrium, which revealed a long post pacing interval.  Entrainment was performed from the cavotricuspid isthmus which revealed a post pacing interval equal to the tachycardia cycle length.  The HV interval measured 55 msec.  I elected to perform cavotricuspid isthmus ablation as this was felt to represent isthmus-dependent right atrial flutter.  A 7-French [employer] II XP 8 mm ablation catheter was therefore introduced through the right common femoral vein and advanced into the right atrium.  Mapping of the cavotricuspid isthmus revealed a standard isthmus.  A series of five radiofrequency applications were delivered along the cavotricuspid isthmus with a target temperature of 60 degrees at 50 watts for 120 seconds each.  The tachycardia slowed and then terminated during ablation.  Following ablation, a 7-French dual decapolar Halo catheter was introduced through the right common femoral vein and positioned around the tricuspid valve anulus.  Differential atrial  pacing was performed from the low lateral right atrium which revealed complete bidirectional cavotricuspid isthmus block with a stimulus to earliest atrial activation recorded bidirectional across the isthmus measuring 200 msec.  The patient was observed for 20 minutes without return of conduction through the cavotricuspid isthmus.  Following ablation, atrial pacing was performed, which revealed an AV Wenckebach cycle length of 480  msec.  There was no evidence of PR greater than RR.  No arrhythmias were induced with pacing down to a cycle length of 230 msec. Following ablation, the AH interval measured 79 msec with an HV interval of 59 msec.  Ventricular pacing was performed, which revealed VA dissociation.  The procedure was therefore considered completed.  All catheters were removed and the sheaths were aspirated and flushed.  The sheaths were removed and hemostasis was assured.  There were no early apparent complications.  CONCLUSIONS: 1. Isthmus-dependent right atrial flutter upon presentation,     successfully ablated along the usual cavotricuspid isthmus. 2. Complete bidirectional cavotricuspid isthmus block achieved. 3. No inducible arrhythmias following ablation. 4. No early apparent complications.     [name], MD     JA/MEDQ  D:  09/28/2011  T:  09/28/2011  Job:  [id number]  cc:   Corky Crafts, MD

## 2011-09-28 NOTE — Brief Op Note (Signed)
09/28/2011  9:21 AM  PATIENT:  Tim Ponce  78 y.o. male  PRE-OPERATIVE DIAGNOSIS:  A Flutter  POST-OPERATIVE DIAGNOSIS:  * No post-op diagnosis entered *  PROCEDURE:  Procedure(s): ATRIAL FLUTTER ABLATION  SURGEON:  Surgeon(s): Gardiner Rhyme, MD  PHYSICIAN ASSISTANT:   ASSISTANTS: none   ANESTHESIA:   IV sedation  EBL:     BLOOD ADMINISTERED:none  DRAINS: none   LOCAL MEDICATIONS USED:  LIDOCAINE 5CC  SPECIMEN:  No Specimen  DISPOSITION OF SPECIMEN:  N/A  COUNTS:  YES  TOURNIQUET:  * No tourniquets in log *  DICTATION: .Other Dictation: Dictation Number [id number]  PLAN OF CARE: Admit for overnight observation  PATIENT DISPOSITION:  PACU - hemodynamically stable.   Delay start of Pharmacological VTE agent (>24hrs) due to surgical blood loss or risk of bleeding:  {YES/NO/NOT APPLICABLE:20182

## 2011-09-28 NOTE — Anesthesia Postprocedure Evaluation (Signed)
  Anesthesia Post-op Note  Patient: Tim Ponce  Procedure(s) Performed:  ATRIAL FLUTTER ABLATION  Patient Location: Cath Lab  Anesthesia Type: MAC  Level of Consciousness: awake, alert , oriented and patient cooperative  Airway and Oxygen Therapy: Patient Spontanous Breathing  Post-op Pain: none  Post-op Assessment: Post-op Vital signs reviewed, Patient's Cardiovascular Status Stable, Respiratory Function Stable, Patent Airway and No signs of Nausea or vomiting  Post-op Vital Signs: Reviewed and stable  Complications: No apparent anesthesia complications

## 2011-09-28 NOTE — Treatment Plan (Signed)
Doing well s/p ablation without events Groin is ok  Hemodynamically stable and maintaining sinus rhythm  Will discharge to home Follow-up with me in 4 weeks. He will contact my office if any problems arise.

## 2011-09-28 NOTE — Transfer of Care (Signed)
Immediate Anesthesia Transfer of Care Note  Patient: Tim Ponce  Procedure(s) Performed:  ATRIAL FLUTTER ABLATION  Patient Location: PACU and Cath Lab  Anesthesia Type: MAC  Level of Consciousness: awake, alert , oriented and patient cooperative  Airway & Oxygen Therapy: Patient Spontanous Breathing  Post-op Assessment: Report given to PACU RN, Post -op Vital signs reviewed and stable and Patient moving all extremities X 4  Post vital signs: Reviewed and stable  Complications: No apparent anesthesia complications

## 2011-10-08 ENCOUNTER — Telehealth: Payer: Self-pay | Admitting: Internal Medicine

## 2011-10-08 NOTE — Telephone Encounter (Signed)
Appt made with Lawson Fiscal for 10/11/2011

## 2011-10-08 NOTE — Telephone Encounter (Signed)
New problem Pt said he is in afib again and he wants to know should he come in before next month. Please let him know

## 2011-10-11 ENCOUNTER — Encounter: Payer: Self-pay | Admitting: Nurse Practitioner

## 2011-10-11 ENCOUNTER — Ambulatory Visit (INDEPENDENT_AMBULATORY_CARE_PROVIDER_SITE_OTHER): Payer: Medicare Other | Admitting: Nurse Practitioner

## 2011-10-11 VITALS — BP 118/78 | HR 60 | Ht 72.0 in | Wt 210.0 lb

## 2011-10-11 DIAGNOSIS — I4892 Unspecified atrial flutter: Secondary | ICD-10-CM

## 2011-10-11 DIAGNOSIS — I10 Essential (primary) hypertension: Secondary | ICD-10-CM

## 2011-10-11 DIAGNOSIS — I480 Paroxysmal atrial fibrillation: Secondary | ICD-10-CM

## 2011-10-11 DIAGNOSIS — I4891 Unspecified atrial fibrillation: Secondary | ICD-10-CM

## 2011-10-11 NOTE — Patient Instructions (Addendum)
You are back in sinus today.  Stay on your current medicines.  We will see you back as needed.  Call the [facility] office at [phone] if you have any questions, problems or concerns.

## 2011-10-11 NOTE — Assessment & Plan Note (Signed)
He is back in sinus today. Has dilated atria. Best served by antiarrhythmic therapy and long term coumadin. Does not seem symptomatic with his atrial fib.

## 2011-10-11 NOTE — Assessment & Plan Note (Signed)
He is currently in sinus. Says he had recurrent atrial fib earlier this month and no recurrent atrial flutter. Would recommend an event monitor if it is difficult to discern based on symptoms. Remains on his coumadin and antiarrhythmic therapy. We will be available as needed and otherwise defer his management back to Dr. Eldridge Dace.

## 2011-10-11 NOTE — Assessment & Plan Note (Signed)
Blood pressure looks good today. His readings from home are reviewed. No change with his current therapies.

## 2011-10-11 NOTE — Progress Notes (Signed)
Tim Ponce Date of Birth: April 18, 1931 Medical Record #[id number]  History of Present Illness: Tim Ponce is seen back today for a follow up visit. He is seen for Dr. Johney Frame. He is a patient of Dr. Hoyle Barr as well. He has had a recent atrial flutter ablation on January 8th. He also has PAF. He is managed with antiarrhythmic therapy and is maintained on chronic coumadin.  He comes in today. He said he thought he needed a check up. He has been back to see Dr. Eldridge Dace on the 15th. Was in atrial fib on that day. Not really symptomatic. He has not seen any more fast heart rates. Remains on his anticoagulation. No chest pain or shortness of breath. Says he has been back in sinus over the last several days. Does have fair amount of alcohol intake. ? Is this is a trigger for him.   Current Outpatient Prescriptions on File Prior to Visit  Medication Sig Dispense Refill  . amLODipine (NORVASC) 5 MG tablet Take 5 mg by mouth daily.       . calcitRIOL (ROCALTROL) 0.25 MCG capsule Take 0.25 mcg by mouth daily.       . carvedilol (COREG) 12.5 MG tablet Take 25 mg by mouth 2 (two) times daily with a meal.       . fish oil-omega-3 fatty acids 1000 MG capsule Take 1 g by mouth daily. Taking 1200 daily      . furosemide (LASIX) 40 MG tablet Take 40 mg by mouth daily. Monday AND Thursday MORNING      . lamoTRIgine (LAMICTAL) 200 MG tablet Take 200 mg by mouth daily at 8 pm.       . levothyroxine (SYNTHROID, LEVOTHROID) 112 MCG tablet Take 112 mcg by mouth daily.       Marland Kitchen omeprazole (PRILOSEC) 20 MG capsule Take 20 mg by mouth daily.       . propafenone (RYTHMOL) 225 MG tablet Take 225 mg by mouth 2 (two) times daily.       . simvastatin (ZOCOR) 20 MG tablet Take 20 mg by mouth at bedtime.       Marland Kitchen warfarin (COUMADIN) 5 MG tablet Take 5 mg by mouth daily.       . tadalafil (CIALIS) 5 MG tablet Take 5 mg by mouth daily as needed. ERECTILE DYSFUNCTION        No Known Allergies  Past Medical History  Diagnosis  Date  . Atrial fibrillation   . Atrial flutter     s/p ablation Jan 2013  . Chronic renal failure   . GERD (gastroesophageal reflux disease)   . Hypertension   . Hypothyroidism   . DJD (degenerative joint disease)     s/p L TKR 12/15/09  . Hyperparathyroidism   . Bipolar disorder   . Obstructive sleep apnea     compliant with CPAP  . Coronary artery disease     mild, 60% stenosis of prox LAD per CTA 2007  . Depression   . Pericarditis     age 26    Past Surgical History  Procedure Date  . Bilateral total knee arthoplasty     History  Smoking status  . Former Smoker  Smokeless tobacco  . Not on file  Comment: quit x 25 years    History  Alcohol Use  . Yes    1-2 glasses of red wine several times per week    History reviewed. No pertinent family history.  Review of Systems: The review  of systems is positive for recent episode of PAF.  All other systems were reviewed and are negative.  Physical Exam: BP 118/78  Pulse 60  Ht 6' (1.829 m)  Wt 210 lb (95.255 kg)  BMI 28.48 kg/m2 Patient is very pleasant and in no acute distress. Little anxious. Skin is warm and dry. Color is normal.  HEENT is unremarkable. Normocephalic/atraumatic. PERRL. Sclera are nonicteric. Neck is supple. No masses. No JVD. Lungs are clear. Cardiac exam shows a regular rate and rhythm. Abdomen is soft. Extremities are without edema. Gait and ROM are intact. No gross neurologic deficits noted.   LABORATORY DATA: EKG today shows sinus with 1st degree AV block with RBBB. Rate of 61. His echo report is also reviewed prior to the ablation. EF is low normal at 50%. Mild aortic root dilatation, dilated atria, atrial septal aneurysm.   Assessment / Plan:

## 2011-11-03 NOTE — Telephone Encounter (Signed)
Follow up from previous call:  Per Tim Ponce,  Patient was seen on yesterday  patient back in AFIB. What's the next step regarding patient.

## 2012-03-09 ENCOUNTER — Encounter (HOSPITAL_COMMUNITY): Payer: Self-pay

## 2012-03-09 ENCOUNTER — Emergency Department (HOSPITAL_COMMUNITY): Payer: Medicare Other

## 2012-03-09 ENCOUNTER — Emergency Department (HOSPITAL_COMMUNITY)
Admission: EM | Admit: 2012-03-09 | Discharge: 2012-03-09 | Disposition: A | Discharge status: Home / Self Care | Payer: Medicare Other | Attending: Emergency Medicine | Admitting: Emergency Medicine

## 2012-03-09 DIAGNOSIS — I2510 Atherosclerotic heart disease of native coronary artery without angina pectoris: Secondary | ICD-10-CM | POA: Insufficient documentation

## 2012-03-09 DIAGNOSIS — T45515A Adverse effect of anticoagulants, initial encounter: Secondary | ICD-10-CM | POA: Insufficient documentation

## 2012-03-09 DIAGNOSIS — W19XXXA Unspecified fall, initial encounter: Secondary | ICD-10-CM | POA: Insufficient documentation

## 2012-03-09 DIAGNOSIS — S0100XA Unspecified open wound of scalp, initial encounter: Secondary | ICD-10-CM | POA: Insufficient documentation

## 2012-03-09 DIAGNOSIS — I129 Hypertensive chronic kidney disease with stage 1 through stage 4 chronic kidney disease, or unspecified chronic kidney disease: Secondary | ICD-10-CM | POA: Insufficient documentation

## 2012-03-09 DIAGNOSIS — N189 Chronic kidney disease, unspecified: Secondary | ICD-10-CM | POA: Insufficient documentation

## 2012-03-09 DIAGNOSIS — I4891 Unspecified atrial fibrillation: Secondary | ICD-10-CM | POA: Insufficient documentation

## 2012-03-09 DIAGNOSIS — S0003XA Contusion of scalp, initial encounter: Secondary | ICD-10-CM | POA: Insufficient documentation

## 2012-03-09 DIAGNOSIS — R791 Abnormal coagulation profile: Secondary | ICD-10-CM | POA: Insufficient documentation

## 2012-03-09 DIAGNOSIS — Z7901 Long term (current) use of anticoagulants: Secondary | ICD-10-CM | POA: Insufficient documentation

## 2012-03-09 DIAGNOSIS — S0101XA Laceration without foreign body of scalp, initial encounter: Secondary | ICD-10-CM

## 2012-03-09 DIAGNOSIS — D6832 Hemorrhagic disorder due to extrinsic circulating anticoagulants: Secondary | ICD-10-CM

## 2012-03-09 LAB — DIFFERENTIAL
Basophils Absolute: 0.1 10*3/uL (ref 0.0–0.1)
Lymphocytes Relative: 10 % — ABNORMAL LOW (ref 12–46)
Lymphs Abs: 1.3 10*3/uL (ref 0.7–4.0)
Monocytes Absolute: 1.2 10*3/uL — ABNORMAL HIGH (ref 0.1–1.0)
Monocytes Relative: 10 % (ref 3–12)
Neutro Abs: 9.7 10*3/uL — ABNORMAL HIGH (ref 1.7–7.7)

## 2012-03-09 LAB — BASIC METABOLIC PANEL
CO2: 26 mEq/L (ref 19–32)
Chloride: 101 mEq/L (ref 96–112)
GFR calc non Af Amer: 22 mL/min — ABNORMAL LOW (ref >90)
Glucose, Bld: 100 mg/dL — ABNORMAL HIGH (ref 70–99)
Potassium: 4.8 mEq/L (ref 3.5–5.1)
Sodium: 136 mEq/L (ref 135–145)

## 2012-03-09 LAB — CBC
HCT: 30.5 % — ABNORMAL LOW (ref 39.0–52.0)
Hemoglobin: 10 g/dL — ABNORMAL LOW (ref 13.0–17.0)
RBC: 3.23 MIL/uL — ABNORMAL LOW (ref 4.22–5.81)
WBC: 12.6 10*3/uL — ABNORMAL HIGH (ref 4.0–10.5)

## 2012-03-09 NOTE — ED Notes (Addendum)
Un-witnessed fall @ home. Patient amnesic to the event and unsure of details of fall. Took Ambien x 2 tonight which is out of his norm. Hematoma and small laceration to right temple. Bleeding controlled with pressure dressing. Complains of right knee pain, small abrasion to site and drowsiness. AAOx4, VSS, NAD.

## 2012-03-09 NOTE — ED Provider Notes (Signed)
History     CSN: [id number]  Arrival date & time 03/09/12  0004   First MD Initiated Contact with Patient 03/09/12 0005      Chief Complaint  Patient presents with  . Fall    (Consider location/radiation/quality/duration/timing/severity/associated sxs/prior treatment) Patient is a 78 y.o. male presenting with fall. The history is provided by the patient.  Fall  He states that he was in his bathroom when he just felt weak and fell. He suffered a laceration to the right side of his head. He is on warfarin and INR was very high yesterday and that was over 7 study was told not to take his warfarin today. He denies headache, nausea, vomiting. He was treated by EMS by application of a dressing and full spinal immobilization and transported to the emergency department. Pain is moderate and he rates it at 5/10. He he states his last tetanus immunization was one year ago. He denies other injury. He denies loss of consciousness.  Past Medical History  Diagnosis Date  . Atrial fibrillation   . Atrial flutter     s/p ablation Sep 28, 2011  . Chronic renal failure   . GERD (gastroesophageal reflux disease)   . Hypertension   . Hypothyroidism   . DJD (degenerative joint disease)     s/p L TKR 12/15/09  . Hyperparathyroidism   . Bipolar disorder   . Obstructive sleep apnea     compliant with CPAP  . Coronary artery disease     mild, 60% stenosis of prox LAD per CTA 2007  . Depression   . Pericarditis     age 43    Past Surgical History  Procedure Date  . Bilateral total knee arthoplasty   . Atrial flutter ablation 09/28/2011    No family history on file.  History  Substance Use Topics  . Smoking status: Former Games developer  . Smokeless tobacco: Not on file   Comment: quit x 25 years  . Alcohol Use: Yes     1-2 glasses of red wine several times per week      Review of Systems  All other systems reviewed and are negative.    Allergies  Review of patient's allergies indicates  no known allergies.  Home Medications   Current Outpatient Rx  Name Route Sig Dispense Refill  . AMLODIPINE BESYLATE 5 MG PO TABS Oral Take 5 mg by mouth daily.     Marland Kitchen CALCITRIOL 0.25 MCG PO CAPS Oral Take 0.25 mcg by mouth daily.     Marland Kitchen CARVEDILOL 12.5 MG PO TABS Oral Take 25 mg by mouth 2 (two) times daily with a meal.     . OMEGA-3 FATTY ACIDS 1000 MG PO CAPS Oral Take 1 g by mouth daily. Taking 1200 daily    . FUROSEMIDE 40 MG PO TABS Oral Take 40 mg by mouth daily. Monday AND Thursday MORNING    . LAMOTRIGINE 200 MG PO TABS Oral Take 200 mg by mouth daily at 8 pm.     . LEVOTHYROXINE SODIUM 112 MCG PO TABS Oral Take 112 mcg by mouth daily.     Marland Kitchen OMEPRAZOLE 20 MG PO CPDR Oral Take 20 mg by mouth daily.     Marland Kitchen PROPAFENONE HCL 225 MG PO TABS Oral Take 225 mg by mouth 2 (two) times daily.     Marland Kitchen SIMVASTATIN 20 MG PO TABS Oral Take 20 mg by mouth at bedtime.     Marland Kitchen TADALAFIL 5 MG PO TABS Oral  Take 5 mg by mouth daily as needed. ERECTILE DYSFUNCTION    . WARFARIN SODIUM 5 MG PO TABS Oral Take 5 mg by mouth daily.       BP 151/85  Pulse 57  Temp 98.3 F (36.8 C) (Oral)  SpO2 96%  Physical Exam  Nursing note and vitals reviewed.  78 year old male on a long spine board with stiff cervical collar in place. Vital signs are significant for bradycardia with heart rate of 57. and mild hypertension with blood pressure 151/85. Oxygen saturation is 96% which is normal. There is a laceration in the right frontoparietal area with dressing in place. TMs are clear without CSF otorrhea or hemotympanum. PERRLA, EOMI. Neck is nontender. Back is nontender. Lungs are clear without rales, wheezes, or rhonchi. Heart has regular rate rhythm without murmur. Abdomen is soft, flat, nontender without masses or hepatosplenomegaly. Extremities have trace edema, mild venous stasis changes, full range of motion is present without pain. Skin is warm and dry without other rash. Several ecchymoses are present of varying ages.  Neurologic: He is awake, alert, oriented. Cranial nerves are intact. There are no motor or sensory deficits.  ED Course  LACERATION REPAIR Date/Time: 03/09/2012 2:30 AM Performed by: Dione Booze Authorized by: Preston Fleeting, Bradd Merlos Consent: Verbal consent obtained. Written consent not obtained. Risks and benefits: risks, benefits and alternatives were discussed Consent given by: patient Patient understanding: patient states understanding of the procedure being performed Patient consent: the patient's understanding of the procedure matches consent given Procedure consent: procedure consent matches procedure scheduled Relevant documents: relevant documents present and verified Test results: test results available and properly labeled Site marked: the operative site was marked Required items: required blood products, implants, devices, and special equipment available Patient identity confirmed: verbally with patient and arm band Time out: Immediately prior to procedure a "time out" was called to verify the correct patient, procedure, equipment, support staff and site/side marked as required. Body area: head/neck Location details: scalp Laceration length: 2 cm Anesthesia: local infiltration Local anesthetic: lidocaine 2% with epinephrine Anesthetic total: 2.5 ml Patient sedated: no Preparation: Patient was prepped and draped in the usual sterile fashion. Amount of cleaning: standard Skin closure: staples Number of sutures: 5 Approximation: close Approximation difficulty: simple Dressing: pressure dressing Patient tolerance: Patient tolerated the procedure well with no immediate complications. Comments: There is a large hematoma present. When this was evacuated, a fairly steady bleeding was noted. After applying staples, bleeding was controlled with pressure. He will have a pressure dressing by.   (including critical care time)  Results for orders placed during the hospital encounter of  03/09/12  CBC      Component Value Range   WBC 12.6 (*) 4.0 - 10.5 K/uL   RBC 3.23 (*) 4.22 - 5.81 MIL/uL   Hemoglobin 10.0 (*) 13.0 - 17.0 g/dL   HCT 09.8 (*) 11.9 - 14.7 %   MCV 94.4  78.0 - 100.0 fL   MCH 31.0  26.0 - 34.0 pg   MCHC 32.8  30.0 - 36.0 g/dL   RDW 82.9  56.2 - 13.0 %   Platelets 186  150 - 400 K/uL  DIFFERENTIAL      Component Value Range   Neutrophils Relative 77  43 - 77 %   Neutro Abs 9.7 (*) 1.7 - 7.7 K/uL   Lymphocytes Relative 10 (*) 12 - 46 %   Lymphs Abs 1.3  0.7 - 4.0 K/uL   Monocytes Relative 10  3 - 12 %  Monocytes Absolute 1.2 (*) 0.1 - 1.0 K/uL   Eosinophils Relative 3  0 - 5 %   Eosinophils Absolute 0.3  0.0 - 0.7 K/uL   Basophils Relative 1  0 - 1 %   Basophils Absolute 0.1  0.0 - 0.1 K/uL  PROTIME-INR      Component Value Range   Prothrombin Time 41.3 (*) 11.6 - 15.2 seconds   INR 4.22 (*) 0.00 - 1.49  BASIC METABOLIC PANEL      Component Value Range   Sodium 136  135 - 145 mEq/L   Potassium 4.8  3.5 - 5.1 mEq/L   Chloride 101  96 - 112 mEq/L   CO2 26  19 - 32 mEq/L   Glucose, Bld 100 (*) 70 - 99 mg/dL   BUN 32 (*) 6 - 23 mg/dL   Creatinine, Ser 9.60 (*) 0.50 - 1.35 mg/dL   Calcium 8.9  8.4 - 45.4 mg/dL   GFR calc non Af Amer 22 (*) >90 mL/min   GFR calc Af Amer 25 (*) >90 mL/min   Ct Head Wo Contrast  03/09/2012  *RADIOLOGY REPORT*  Clinical Data:  Unwitnessed fall at home; amnesia.  Scalp hematoma and small laceration at the right temple.  Drowsiness.  Concern for cervical spine injury.  CT HEAD WITHOUT CONTRAST AND CT CERVICAL SPINE WITHOUT CONTRAST  Technique:  Multidetector CT imaging of the head and cervical spine was performed following the standard protocol without intravenous contrast.  Multiplanar CT image reconstructions of the cervical spine were also generated.  Comparison: CT of the head and neck performed 12/23/2009  CT HEAD  Findings: There is no evidence of acute infarction, mass lesion, or intra- or extra-axial hemorrhage  on CT.  Prominence of the ventricles and sulci reflects mild cortical volume loss.  Scattered periventricular and subcortical white matter change likely reflects small vessel ischemic microangiopathy.  The posterior fossa, including the cerebellum, brainstem and fourth ventricle, is within normal limits.  The basal ganglia are unremarkable in appearance.  The cerebral hemispheres demonstrate grossly normal gray-white differentiation.  No mass effect or midline shift is seen.  There is no evidence of fracture; visualized osseous structures are unremarkable in appearance.  The orbits are within normal limits. There is minimal partial opacification of the left maxillary sinus; the remaining paranasal sinuses and mastoid air cells are well- aerated.  A prominent scalp hematoma and laceration are noted at the right frontoparietal calvarium, with associated soft tissue air.  IMPRESSION:  1.  No evidence of traumatic intracranial injury or fracture. 2.  Prominent scalp hematoma and laceration at the right frontoparietal calvarium, with associated soft tissue air. 3.  Mild cortical volume loss and scattered small vessel ischemic microangiopathy. 4.  Minimal partial opacification of the left maxillary sinus.  CT CERVICAL SPINE  Findings: There is no evidence of fracture or subluxation. Vertebral bodies demonstrate normal height and alignment.  Mild multilevel disc space narrowing is noted along the cervical spine, with associated anterior and posterior disc osteophyte complexes. Facet disease is noted along the cervical spine.  Prevertebral soft tissues are within normal limits.  The thyroid gland is unremarkable in appearance.  The visualized lung apices are clear.  Soft tissue swelling is partially imaged along the anterior right side of the neck.  IMPRESSION:  1.  No evidence of fracture or subluxation along the cervical spine. 2.  Mild degenerative change noted along the cervical spine. 3.  Soft tissue swelling noted  along the anterior right  side of the neck; suggest clinical correlation for associated bruising.  Original Report Authenticated By: Tonia Ghent, M.D.   Ct Cervical Spine Wo Contrast  03/09/2012  *RADIOLOGY REPORT*  Clinical Data:  Unwitnessed fall at home; amnesia.  Scalp hematoma and small laceration at the right temple.  Drowsiness.  Concern for cervical spine injury.  CT HEAD WITHOUT CONTRAST AND CT CERVICAL SPINE WITHOUT CONTRAST  Technique:  Multidetector CT imaging of the head and cervical spine was performed following the standard protocol without intravenous contrast.  Multiplanar CT image reconstructions of the cervical spine were also generated.  Comparison: CT of the head and neck performed 12/23/2009  CT HEAD  Findings: There is no evidence of acute infarction, mass lesion, or intra- or extra-axial hemorrhage on CT.  Prominence of the ventricles and sulci reflects mild cortical volume loss.  Scattered periventricular and subcortical white matter change likely reflects small vessel ischemic microangiopathy.  The posterior fossa, including the cerebellum, brainstem and fourth ventricle, is within normal limits.  The basal ganglia are unremarkable in appearance.  The cerebral hemispheres demonstrate grossly normal gray-white differentiation.  No mass effect or midline shift is seen.  There is no evidence of fracture; visualized osseous structures are unremarkable in appearance.  The orbits are within normal limits. There is minimal partial opacification of the left maxillary sinus; the remaining paranasal sinuses and mastoid air cells are well- aerated.  A prominent scalp hematoma and laceration are noted at the right frontoparietal calvarium, with associated soft tissue air.  IMPRESSION:  1.  No evidence of traumatic intracranial injury or fracture. 2.  Prominent scalp hematoma and laceration at the right frontoparietal calvarium, with associated soft tissue air. 3.  Mild cortical volume loss and  scattered small vessel ischemic microangiopathy. 4.  Minimal partial opacification of the left maxillary sinus.  CT CERVICAL SPINE  Findings: There is no evidence of fracture or subluxation. Vertebral bodies demonstrate normal height and alignment.  Mild multilevel disc space narrowing is noted along the cervical spine, with associated anterior and posterior disc osteophyte complexes. Facet disease is noted along the cervical spine.  Prevertebral soft tissues are within normal limits.  The thyroid gland is unremarkable in appearance.  The visualized lung apices are clear.  Soft tissue swelling is partially imaged along the anterior right side of the neck.  IMPRESSION:  1.  No evidence of fracture or subluxation along the cervical spine. 2.  Mild degenerative change noted along the cervical spine. 3.  Soft tissue swelling noted along the anterior right side of the neck; suggest clinical correlation for associated bruising.  Original Report Authenticated By: Tonia Ghent, M.D.      1. Fall   2. Scalp laceration   3. Warfarin-induced coagulopathy       MDM  Fall with scalp laceration. He has a history of being over anticoagulated yesterday. INR will need to be rechecked today and CT scan obtained to rule out intracranial bleeding. Consideration will need to be given to at least partially reversing his anticoagulation.        Dione Booze, MD 03/09/12 [phone]

## 2012-03-09 NOTE — ED Notes (Signed)
Suture cart at bedside per EDP request 

## 2012-03-09 NOTE — Discharge Instructions (Signed)
Staples need to be removed in 7 days. They can be removed here, at our urgent care Center, or at her doctor's office. Keep your dressing on until tomorrow morning. If you start to have some bleeding there, apply direct pressure. If you're unable to control the bleeding, return to the emergency department. Tonight, your INR was 4.22 which is still higher than it should be. Do not take your warfarin again until instructed by the clinic which monitors your warfarin.  Laceration Care, Adult A laceration is a cut or lesion that goes through all layers of the skin and into the tissue just beneath the skin. TREATMENT  Some lacerations may not require closure. Some lacerations may not be able to be closed due to an increased risk of infection. It is important to see your caregiver as soon as possible after an injury to minimize the risk of infection and maximize the opportunity for successful closure. If closure is appropriate, pain medicines may be given, if needed. The wound will be cleaned to help prevent infection. Your caregiver will use stitches (sutures), staples, wound glue (adhesive), or skin adhesive strips to repair the laceration. These tools bring the skin edges together to allow for faster healing and a better cosmetic outcome. However, all wounds will heal with a scar. Once the wound has healed, scarring can be minimized by covering the wound with sunscreen during the day for 1 full year. HOME CARE INSTRUCTIONS  For sutures or staples:  Keep the wound clean and dry.   If you were given a bandage (dressing), you should change it at least once a day. Also, change the dressing if it becomes wet or dirty, or as directed by your caregiver.   Wash the wound with soap and water 2 times a day. Rinse the wound off with water to remove all soap. Pat the wound dry with a clean towel.   After cleaning, apply a thin layer of the antibiotic ointment as recommended by your caregiver. This will help prevent  infection and keep the dressing from sticking.   You may shower as usual after the first 24 hours. Do not soak the wound in water until the sutures are removed.   Only take over-the-counter or prescription medicines for pain, discomfort, or fever as directed by your caregiver.   Get your sutures or staples removed as directed by your caregiver.  For skin adhesive strips:  Keep the wound clean and dry.   Do not get the skin adhesive strips wet. You may bathe carefully, using caution to keep the wound dry.   If the wound gets wet, pat it dry with a clean towel.   Skin adhesive strips will fall off on their own. You may trim the strips as the wound heals. Do not remove skin adhesive strips that are still stuck to the wound. They will fall off in time.  For wound adhesive:  You may briefly wet your wound in the shower or bath. Do not soak or scrub the wound. Do not swim. Avoid periods of heavy perspiration until the skin adhesive has fallen off on its own. After showering or bathing, gently pat the wound dry with a clean towel.   Do not apply liquid medicine, cream medicine, or ointment medicine to your wound while the skin adhesive is in place. This may loosen the film before your wound is healed.   If a dressing is placed over the wound, be careful not to apply tape directly over the skin  adhesive. This may cause the adhesive to be pulled off before the wound is healed.   Avoid prolonged exposure to sunlight or tanning lamps while the skin adhesive is in place. Exposure to ultraviolet light in the first year will darken the scar.   The skin adhesive will usually remain in place for 5 to 10 days, then naturally fall off the skin. Do not pick at the adhesive film.  You may need a tetanus shot if:  You cannot remember when you had your last tetanus shot.   You have never had a tetanus shot.  If you get a tetanus shot, your arm may swell, get red, and feel warm to the touch. This is common  and not a problem. If you need a tetanus shot and you choose not to have one, there is a rare chance of getting tetanus. Sickness from tetanus can be serious. SEEK MEDICAL CARE IF:   You have redness, swelling, or increasing pain in the wound.   You see a red line that goes away from the wound.   You have yellowish-white fluid (pus) coming from the wound.   You have a fever.   You notice a bad smell coming from the wound or dressing.   Your wound breaks open before or after sutures have been removed.   You notice something coming out of the wound such as wood or glass.   Your wound is on your hand or foot and you cannot move a finger or toe.  SEEK IMMEDIATE MEDICAL CARE IF:   Your pain is not controlled with prescribed medicine.   You have severe swelling around the wound causing pain and numbness or a change in color in your arm, hand, leg, or foot.   Your wound splits open and starts bleeding.   You have worsening numbness, weakness, or loss of function of any joint around or beyond the wound.   You develop painful lumps near the wound or on the skin anywhere on your body.  MAKE SURE YOU:   Understand these instructions.   Will watch your condition.   Will get help right away if you are not doing well or get worse.  Document Released: 09/06/2005 Document Revised: 08/26/2011 Document Reviewed: 03/02/2011 [facility] Patient Information 2012 [place], [place].  Home Safety and Preventing Falls Falls are a leading cause of injury and while they affect all age groups, falls have greater short-term and long-term impact on older age groups. However, falls should not be a part of life or aging. It is possible for individuals and their families to use preventive measures to significantly decrease the likelihood that anyone, especially an older adult, will fall. There are many simple measures which can make your home safer with respect to preventing falls. The following actions can help  reduce falls among all members of your family and are especially important as you age, when your balance, lower limb strength, coordination, and eyesight may be declining. The use of preventive measures will help to reduce you and your family's risk of falls and serious medical consequences. OUTDOORS  Repair cracks and edges of walkways and driveways.   Remove high doorway thresholds and trim shrubbery on the main path into your home.   Ensure there is good outside lighting at main entrances and along main walkways.   Clear walkways of tools, rocks, debris, and clutter.   Check that handrails are not broken and are securely fastened. Both sides of steps should have handrails.  In the garage, be attentive to and clean up grease or oil spills on the cement. This can make the surface extremely slippery.   In winter, have leaves, snow, and ice cleared regularly.   Use sand or salt on walkways during winter months.  BATHROOM  Install grab bars by the toilet and in the tub and shower.   Use non-skid mats or decals in the tub or shower.   If unable to easily stand unsupported while showering, place a plastic non slip stool in the shower to sit on when needed.   Install night lights.   Keep floors dry and clean up all water on the floor immediately.   Remove soap buildup in tub or shower on a regular basis.   Secure bath mats with non-slip, double-sided rug tape.   Remove tripping hazards from the floors.  BEDROOMS  Install night lights.   Do not use oversized bedding.   Make sure a bedside light is easy to reach.   Keep a telephone by your bedside.   Make sure that you can get in and out of your bed easily.   Have a firm chair, with side arms, to use for getting dressed.   Remove clutter from around closets.   Store clothing, bed coverings, and other household items where you can reach them comfortably.   Remove tripping hazards from the floor.  LIVING AREAS AND  STAIRWAYS  Turn on lights to avoid having to walk through dark areas.   Keep lighting uniform in each room. Place brighter lightbulbs in darker areas, including stairways.   Replace lightbulbs that burn out in stairways immediately.   Arrange furniture to provide for clear pathways.   Keep furniture in the same place.   Eliminate or tape down electrical cables in high traffic areas.   Place handrails on both sides of stairways. Use handrails when going up or down stairs.   Most falls occur on the top or bottom 3 steps.   Fix any loose handrails. Make sure handrails on both sides of the stairways are as long as the stairs.   Remove all walkway obstacles.   Coil or tape electrical cords off to the side of walking areas and out of the way. If using many extension cords, have an electrician put in a new wall outlet to reduce or eliminate them.   Make sure spills are cleaned up quickly and allow time for drying before walking on freshly cleaned floors.   Firmly attach carpet with non-skid or two-sided tape.   Keep frequently used items within easy reach.   Remove tripping hazards such as throw rugs and clutter in walkways. Never leave objects on stairs.   Get rid of throw rugs elsewhere if possible.   Eliminate uneven floor surfaces.   Make sure couches and chairs are easy to get into and out of.   Check carpeting to make sure it is firmly attached along stairs.   Make repairs to worn or loose carpet promptly.   Select a carpet pattern that does not visually hide the edge of steps.   Avoid placing throw rugs or scatter rugs at the top or bottom of stairways, or properly secure with carpet tape to prevent slippage.   Have an electrician put in a light switch at the top and bottom of the stairs.   Get light switches that glow.   Avoid the following practices: hurrying, inattention, obscured vision, carrying large loads, and wearing slip-on shoes.  Be aware of all pets.    KITCHEN  Place items that are used frequently, such as dishes and food, within easy reach.   Keep handles on pots and pans toward the center of the stove. Use back burners when possible.   Make sure spills are cleaned up quickly and allow time for drying.   Avoid walking on wet floors.   Avoid hot utensils and knives.   Position shelves so they are not too high or low.   Place commonly used objects within easy reach.   If necessary, use a sturdy step stool with a grab bar when reaching.   Make sure electrical cables are out of the way.   Do not use floor polish or wax that makes floors slippery.  OTHER HOME FALL PREVENTION STRATEGIES  Wear low heel or rubber sole shoes that are supportive and fit well.   Wear closed toe shoes.   Know and watch for side effects of medications. Have your caregiver or pharmacist look at all your medicines, even over-the-counter medicines. Some medicines can make you sleepy or dizzy.   Exercise regularly. Exercise makes you stronger and improves your balance and coordination.   Limit use of alcohol.   Use eyeglasses if necessary and keep them clean. Have your vision checked every year.   Organize your household in a manner that minimizes the need to walk distances when hurried, or go up and down stairs unnecessarily. For example, have a phone placed on at least each floor of your home. If possible, have a phone beside each sitting or lying area where you spend the most time at home. Keep emergency numbers posted at all phones.   Use non-skid floor wax.   When using a ladder, make sure:   The base is firm.   All ladder feet are on level ground.   The ladder is angled against the wall properly.   When climbing a ladder, face the ladder and hold the ladder rungs firmly.   If reaching, always keep your hips and body weight centered between the rails.   When using a stepladder, make sure it is fully opened and both spreaders are firmly  locked.   Do not climb a closed stepladder.   Avoid climbing beyond the second step from the top of a stepladder and the 4th rung from the top of an extension ladder.   Learn and use mobility aids as needed.   Change positions slowly. Arise slowly from sitting and lying positions. Sit on the edge of your bed before getting to your feet.   If you have a history of falls, ask someone to add color or contrast paint or tape to grab bars and handrails in your home.   If you have a history of falls, ask someone to place contrasting color strips on first and last steps.   Install an electrical emergency response system if you need one, and know how to use it.   If you have a medical or other condition that causes you to have limited physical strength, it is important that you reach out to family and friends for occasional help.  FOR CHILDREN:  If young children are in the home, use safety gates. At the top of stairs use screw-mounted gates; use pressure-mounted gates for the bottom of the stairs and doorways between rooms.   Young children should be taught to descend stairs on their stomachs, feet first, and later using the handrail.   Keep drawers fully  closed to prevent them from being climbed on or pulled out entirely.   Move chairs, cribs, beds and other furniture away from windows.   Consider installing window guards on windows ground floor and up, unless they are emergency fire exits. Make sure they have easy release mechanisms.   Consider installing special locks that only allow the window to be opened to a certain height.   Never rely on window screens to prevent falls.   Never leave babies alone on changing tables, beds or sofas. Use a changing table that has a restraining strap.   When a child can pull to a standing position, the crib mattress should be adjusted to its lowest position. There should be at least 26 inches between the top rails of the crib drop side and the  mattress. Toys, bumper pads, and other objects that can be used as steps to climb out should be removed from the crib.   On bunk beds never allow a child under age 42 to sleep on the top bunk. For older children, if the upper bunk is not against a wall, use guard rails on both sides. No matter how old a child is, keep the guard rails in place on the top bunk since children roll during sleep. Do not permit horseplay on bunks.   Grass and soil surfaces beneath backyard playground equipment should be replaced with hardwood chips, shredded wood mulch, sand, pea gravel, rubber, crushed stone, or another safer material at depths of at least 9 to 12 inches.   When riding bikes or using skates, skateboards, skis, or snowboards, require children to wear helmets. Look for those that have stickers stating that they meet or exceed safety standards.   Vertical posts or pickets in deck, balcony, and stairway railings should be no more than 3 1/2 inches apart if a young baby will have access to the area. The space between horizontal rails or bars, and between the floor and the first horizontal rail or bar, should be no more than 3 1/2 inches.  Document Released: 08/27/2002 Document Revised: 08/26/2011 Document Reviewed: 06/26/2009 [facility] Patient Information 2012 [place], [place].

## 2012-07-25 ENCOUNTER — Encounter (HOSPITAL_COMMUNITY): Payer: Self-pay | Admitting: Pharmacist

## 2012-07-25 DIAGNOSIS — M5116 Intervertebral disc disorders with radiculopathy, lumbar region: Secondary | ICD-10-CM | POA: Diagnosis present

## 2012-07-25 NOTE — H&P (Signed)
Tim Ponce 07/10/2012 1:30 PM Location: [facility] OP Patient #: [id number] DOB: 1931/08/11 Single / Language: [place] / Race: White Male   History of Present Illness(Sharon Gillian Shields; 07/12/2012 4:34 PM) The patient is a 78 year old male who presents with back pain. The patient reports low back symptoms which began 1 month(s) (1/2) ago following a specific injury. The injury occurred 06/03/2012 due to a fall. The pain radiates to the right thigh and right foot. The patient describes the severity of their symptoms as moderate in severity. Prior to being seen today the patient was previously evaluated by a primary physician ([facility] ). Past treatment has included chiropractic manipulation.    Subjective Transcription(Chudney Scheffler D Breklyn Fabrizio, MD; 07/13/2012 10:30 AM)  This is a very pleasant gentleman who is 78 years of age. I was contacted by his chiropractor and asked to work him in because of severe right leg pain. The patient can not recall any specific injury, trauma or event but he describes numbness and discomfort in the anterior aspect and lateral aspect of the right calf and into the foot. He states it has been going on for some time. He does have underlying back pain but he states that the back pain is not as severe.    Family History(Sharon Gillian Shields; 07/12/2012 4:06 PM) Congestive Heart Failure. father Depression. father   Social History(Sharon Gillian Shields; 07/12/2012 4:06 PM) Alcohol use. current drinker; 5-7 per week Current work status. retired Financial planner (Currently). no Drug/Alcohol Rehab (Previously). no Exercise. Exercises weekly; does gym / weights Illicit drug use. no Living situation. live with caregiver Marital status. divorced Number of flights of stairs before winded. 2-3 Pain Contract. no Tobacco / smoke exposure. no Tobacco use. former smoker; smoke(d) 3 or more pack(s) per day   Pregnancy /  Birth History(Sharon Gillian Shields; 07/12/2012 4:06 PM) Pregnant. no   Past Surgical History(Sharon Gillian Shields; 07/12/2012 4:40 PM) Total Knee Replacement - Both   Other Problems(Sharon Gillian Shields; 07/12/2012 4:45 PM) Anemia Anxiety Disorder Cardiac Arrhythmia Chronic Renal Failure Syndrome Coronary artery disease Depression Gastroesophageal Reflux Disease High blood pressure Hypercholesterolemia Hypothyroidism Osteoarthritis Sleep Apnea   Review of Systems(Sharon Gillian Shields; 07/12/2012 4:06 PM) General:Present- Feeling sick. Not Present- Chills, Fever, Night Sweats, Appetite Loss, Fatigue, Weight Gain and Weight Loss. Skin:Not Present- Itching, Rash, Skin Color Changes, Ulcer, Psoriasis and Change in Hair or Nails. HEENT:Not Present- Sensitivity to light, Hearing problems, Nose Bleed and Ringing in the Ears. Neck:Not Present- Swollen Glands and Neck Mass. Respiratory:Not Present- Snoring, Chronic Cough, Bloody sputum and Dyspnea. Cardiovascular:Not Present- Shortness of Breath, Chest Pain, Swelling of Extremities, Leg Cramps and Palpitations. Gastrointestinal:Not Present- Bloody Stool, Heartburn, Abdominal Pain, Vomiting, Nausea and Incontinence of Stool. Male Genitourinary:Present- Nocturia. Not Present- Blood in Urine, Frequency and Incontinence. Musculoskeletal:Present- Muscle Weakness. Not Present- Muscle Pain, Joint Stiffness, Joint Swelling, Joint Pain and Back Pain. Neurological:Present- Burning. Not Present- Tingling, Numbness, Tremor, Headaches and Dizziness. Psychiatric:Present- Anxiety and Depression. Not Present- Memory Loss. Endocrine:Not Present- Cold Intolerance, Heat Intolerance, Excessive hunger and Excessive Thirst. Hematology:Present- Anemia and Easy Bruising. Not Present- Abnormal Bleeding and Blood Clots.   Objective Transcription(Kaseem Vastine D Corisa Montini, MD; 07/13/2012 10:30 AM)  He is alert and oriented times three. No shortness of breath  or chest pain. Abdomen is soft and nontender. He has weakness of his EHL, tibialis anterior 3+ to 4- (positive foot drop), gastrocnemius is 5/5. He has positive straight leg raise test on the right, no motor deficits on the left  side. He has moderate back pain with palpation and range of motion. No obvious skin lesions, abrasions or contusions.    RADIOGRAPHS:  His MRI which was done demonstrates a spondylolisthesis at L4-5 grade 1 with moderate to significant stenosis but he does have a disc herniation at L4-5 with cranial extension. This is posterolateral to the right.    Assessment & Plan(Sharon Gillian Shields; 07/12/2012 4:47 PM) Spondylosis, Lumbosacral (721.3) Current Plans l ESI - Lumbar Spine (72536) (L4-5 right transforaminal ESI for L4-5 HNP with nerve compression)  Lumbar disc herniation (722.10)   Assessments Transcription(Jamesyn Moorefield D Breland Trouten, MD; 07/13/2012 10:30 AM)  At this point in time based on the patient's clinical exam he does have L4 and L5 radiculopathy on the right side. I believe this is due to the acute disc herniation posterolateral to the right at L4-5 which is causing irritation to the existing L4 and traversing L5 nerve root.    Plans Transcription(Yamile Roedl D Annette Bertelson, MD; 07/13/2012 10:30 AM)  At this point we will try an L4-5 transforaminal ESI to see if we can decrease his pain and improve his quality of life. If this is not successful then I would actually recommend proceeding with a lumbar decompression discectomy because of the motor deficits. I am hesitant to recommend a fusion procedure given the fact that he is 81 and he is not having significant back pain. I did tell him that I think if we were to operate the goal would be to help relieve his leg pain and hopefully improve his foot drop.    Miscellaneous Transcription(Anderia Lorenzo D Khale Nigh, MD; 07/13/2012 10:30 AM)  Alvy Beal, MD/MMK  07/25/12 12:10pm  No relief with recent  injection Persistent weakness (foot drop) Discussed risks benefits with patient and son Cleared by Dr Kevan Ny Off coumadin since Saturday 11/2Plan on disectomy/decompression right L4/5 No other changes to physical exam Lungs CTA Heart RRR

## 2012-07-26 ENCOUNTER — Encounter (HOSPITAL_COMMUNITY): Payer: Self-pay | Admitting: Vascular Surgery

## 2012-07-26 ENCOUNTER — Encounter (HOSPITAL_COMMUNITY)
Admission: RE | Admit: 2012-07-26 | Discharge: 2012-07-26 | Disposition: A | Discharge status: Home / Self Care | Payer: Medicare Other | Source: Ambulatory Visit | Attending: Orthopedic Surgery | Admitting: Orthopedic Surgery

## 2012-07-26 ENCOUNTER — Encounter (HOSPITAL_COMMUNITY): Payer: Self-pay

## 2012-07-26 HISTORY — DX: Personal history of other medical treatment: Z92.89

## 2012-07-26 HISTORY — DX: Personal history of other diseases of the digestive system: Z87.19

## 2012-07-26 HISTORY — DX: Unspecified hemorrhoids: K64.9

## 2012-07-26 LAB — BASIC METABOLIC PANEL
GFR calc Af Amer: 23 mL/min — ABNORMAL LOW (ref >90)
GFR calc non Af Amer: 20 mL/min — ABNORMAL LOW (ref >90)
Potassium: 4.3 mEq/L (ref 3.5–5.1)
Sodium: 141 mEq/L (ref 135–145)

## 2012-07-26 LAB — PROTIME-INR
INR: 1.71 — ABNORMAL HIGH (ref 0.00–1.49)
Prothrombin Time: 19.5 seconds — ABNORMAL HIGH (ref 11.6–15.2)

## 2012-07-26 LAB — SURGICAL PCR SCREEN
MRSA, PCR: NEGATIVE
Staphylococcus aureus: NEGATIVE

## 2012-07-26 LAB — CBC
Hemoglobin: 11.5 g/dL — ABNORMAL LOW (ref 13.0–17.0)
RBC: 3.78 MIL/uL — ABNORMAL LOW (ref 4.22–5.81)
WBC: 7 10*3/uL (ref 4.0–10.5)

## 2012-07-26 MED ORDER — CEFAZOLIN SODIUM-DEXTROSE 2-3 GM-% IV SOLR
2.0000 g | INTRAVENOUS | Status: AC
  Administered 2012-07-27: 2 g via INTRAVENOUS
  Filled 2012-07-26: qty 50

## 2012-07-26 NOTE — Consult Note (Signed)
Anesthesia chart review: Patient is a 78 year old male scheduled for right L4-5 discectomy by Dr. Shon Baton on 07/27/2012.  His PAT appointment was on the afternoon of 07/26/12 .  History includes former smoker, atrial fibrillation/flutter status post ablation January 2013, GERD, hypertension, hypothyroidism, bipolar disorder, hyperparathyroidism, CKD stage IV (Dr. Allena Katz), obstructive sleep apnea, depression, pericarditis, CAD (by history in Epic he had 60% proximal LAD stenosis by CTA in 2007; with normal perfusion study in 2010).  PCP is Dr. Johnella Moloney who cleared patient for this procedure.  His primary Cardiologist is Dr. Eldridge Dace.  His EP Cardiologist is Dr. Johney Frame.  He last saw Dr. Eldridge Dace on 06/02/12 and nine month follow-up was recommended.  His last EKG at [facility] on 11/19/11 showed SB, non-specific QRS widening, although according to Dr. Hoyle Barr 06/02/12 exam showed an irregularly irregular rhythm.    Echo on 09/20/11 [facility]) showed left ventricular ejection fraction 45-50%, aortic valve was sclerotic but open well, mild aortic root dilatation (3.9 cm), dilated atria, atrial septal aneurysm.  Nuclear stress test on 08/20/09 [facility]) showed normal myocardial perfusion scan demonstrating an attenuation artifact in inferior region of the myocardium. No ischemia or infarct/scar is seen in the remaining myocardium. Post stress EF 62%.  Chest x-ray on 07/26/2012 showed no acute cardiopulmonary abnormality.  Labs noted.  BUN/Cr 50/2.82 (Cr 2.64-2.91 since 09/20/11, so appears stable).  H/H 11.5/35.8.  Repeat PT/PTT on arrival.  He will be evaluated by his assigned Anesthesiologist on the day of surgery.  Shonna Chock, PA-C

## 2012-07-26 NOTE — Pre-Procedure Instructions (Signed)
20 Tim Ponce  07/26/2012   Your procedure is scheduled on:  Thursday, November 7th.  Report to [facility] at 11:00 AM.  Call this number if you have problems the morning of surgery: [phone]   Remember:   Do not eat food or drink any liquids:After Midnight.     Take these medicines the morning of surgery with A SIP OF WATER: Amiodarone (PAcerone), Amlodipine (Norvasc), Carvedilol (Coreg), Levothyroxine (Synthyroid), Omeprazole (Prilosec).   Do not wear jewelry, make-up or nail polish.  Do not wear lotions, powders, or perfumes. You may wear deodorant.  Do not shave 48 hours prior to surgery. Men may shave face and neck.  Do not bring valuables to the hospital.  Contacts, dentures or bridgework may not be worn into surgery.  Leave suitcase in the car. After surgery it may be brought to your room.  For patients admitted to the hospital, checkout time is 11:00 AM the day of discharge.   Patients discharged the day of surgery will not be allowed to drive home.  Name and phone number of your driver: NA  Special Instructions: Shower using CHG 2 nights before surgery and the night before surgery.  If you shower the day of surgery use CHG.  Use special wash - you have one bottle of CHG for all showers.  You should use approximately 1/3 of the bottle for each shower.   Please read over the following fact sheets that you were given: Pain Booklet, Coughing and Deep Breathing and Surgical Site Infection Prevention

## 2012-07-27 ENCOUNTER — Ambulatory Visit (HOSPITAL_COMMUNITY): Payer: Medicare Other

## 2012-07-27 ENCOUNTER — Encounter (HOSPITAL_COMMUNITY): Payer: Self-pay | Admitting: *Deleted

## 2012-07-27 ENCOUNTER — Observation Stay (HOSPITAL_COMMUNITY)
Admission: RE | Admit: 2012-07-27 | Discharge: 2012-07-31 | Disposition: A | Discharge status: Home / Self Care | Payer: Medicare Other | Source: Ambulatory Visit | Attending: Orthopedic Surgery | Admitting: Orthopedic Surgery

## 2012-07-27 ENCOUNTER — Encounter (HOSPITAL_COMMUNITY): Payer: Self-pay | Admitting: Vascular Surgery

## 2012-07-27 ENCOUNTER — Encounter (HOSPITAL_COMMUNITY)
Admission: RE | Disposition: A | Discharge status: Home / Self Care | Payer: Self-pay | Source: Ambulatory Visit | Attending: Orthopedic Surgery

## 2012-07-27 ENCOUNTER — Ambulatory Visit (HOSPITAL_COMMUNITY): Payer: Medicare Other | Admitting: Vascular Surgery

## 2012-07-27 DIAGNOSIS — M5126 Other intervertebral disc displacement, lumbar region: Principal | ICD-10-CM | POA: Insufficient documentation

## 2012-07-27 DIAGNOSIS — M5116 Intervertebral disc disorders with radiculopathy, lumbar region: Secondary | ICD-10-CM

## 2012-07-27 DIAGNOSIS — Z01812 Encounter for preprocedural laboratory examination: Secondary | ICD-10-CM | POA: Insufficient documentation

## 2012-07-27 DIAGNOSIS — Z01818 Encounter for other preprocedural examination: Secondary | ICD-10-CM | POA: Insufficient documentation

## 2012-07-27 DIAGNOSIS — Z79899 Other long term (current) drug therapy: Secondary | ICD-10-CM | POA: Insufficient documentation

## 2012-07-27 DIAGNOSIS — N189 Chronic kidney disease, unspecified: Secondary | ICD-10-CM | POA: Insufficient documentation

## 2012-07-27 DIAGNOSIS — I2510 Atherosclerotic heart disease of native coronary artery without angina pectoris: Secondary | ICD-10-CM | POA: Insufficient documentation

## 2012-07-27 DIAGNOSIS — E039 Hypothyroidism, unspecified: Secondary | ICD-10-CM | POA: Insufficient documentation

## 2012-07-27 DIAGNOSIS — I129 Hypertensive chronic kidney disease with stage 1 through stage 4 chronic kidney disease, or unspecified chronic kidney disease: Secondary | ICD-10-CM | POA: Insufficient documentation

## 2012-07-27 DIAGNOSIS — Q762 Congenital spondylolisthesis: Secondary | ICD-10-CM | POA: Insufficient documentation

## 2012-07-27 HISTORY — DX: Dependence on other enabling machines and devices: Z99.89

## 2012-07-27 HISTORY — DX: Chronic kidney disease, stage 3 unspecified: N18.30

## 2012-07-27 HISTORY — DX: Chronic kidney disease, stage 3 (moderate): N18.3

## 2012-07-27 HISTORY — PX: LUMBAR LAMINECTOMY/DECOMPRESSION MICRODISCECTOMY: SHX5026

## 2012-07-27 HISTORY — DX: Obstructive sleep apnea (adult) (pediatric): G47.33

## 2012-07-27 HISTORY — PX: OTHER SURGICAL HISTORY: SHX169

## 2012-07-27 HISTORY — DX: Anxiety disorder, unspecified: F41.9

## 2012-07-27 HISTORY — DX: Pure hypercholesterolemia, unspecified: E78.00

## 2012-07-27 HISTORY — DX: Anemia, unspecified: D64.9

## 2012-07-27 SURGERY — LUMBAR LAMINECTOMY/DECOMPRESSION MICRODISCECTOMY 1 LEVEL
Anesthesia: General | Site: Back | Laterality: Right | Wound class: Clean

## 2012-07-27 MED ORDER — OXYCODONE HCL 5 MG PO TABS: 10.0000 mg | ORAL_TABLET | ORAL | Status: DC | PRN

## 2012-07-27 MED ORDER — SODIUM CHLORIDE 0.9 % IV SOLN
INTRAVENOUS | Status: DC
  Administered 2012-07-27: 12:00:00 via INTRAVENOUS

## 2012-07-27 MED ORDER — ZOLPIDEM TARTRATE 5 MG PO TABS: 5.0000 mg | ORAL_TABLET | Freq: Every evening | ORAL | Status: DC | PRN

## 2012-07-27 MED ORDER — MORPHINE SULFATE 2 MG/ML IJ SOLN: 1.0000 mg | INTRAMUSCULAR | Status: DC | PRN

## 2012-07-27 MED ORDER — PREGABALIN 50 MG PO CAPS
75.0000 mg | ORAL_CAPSULE | Freq: Three times a day (TID) | ORAL | Status: DC
  Administered 2012-07-27 – 2012-07-31 (×12): 75 mg via ORAL
  Filled 2012-07-27 (×9): qty 1
  Filled 2012-07-27: qty 2
  Filled 2012-07-27 (×2): qty 1

## 2012-07-27 MED ORDER — METHOCARBAMOL 500 MG PO TABS: 500.0000 mg | ORAL_TABLET | Freq: Four times a day (QID) | ORAL | Status: DC | PRN

## 2012-07-27 MED ORDER — MIDAZOLAM HCL 5 MG/5ML IJ SOLN
INTRAMUSCULAR | Status: DC | PRN
  Administered 2012-07-27: 2 mg via INTRAVENOUS

## 2012-07-27 MED ORDER — AMIODARONE HCL 200 MG PO TABS
200.0000 mg | ORAL_TABLET | Freq: Every morning | ORAL | Status: DC
  Administered 2012-07-28 – 2012-07-31 (×4): 200 mg via ORAL
  Filled 2012-07-27 (×4): qty 1

## 2012-07-27 MED ORDER — BUPIVACAINE-EPINEPHRINE PF 0.25-1:200000 % IJ SOLN
INTRAMUSCULAR | Status: AC
  Filled 2012-07-27: qty 30

## 2012-07-27 MED ORDER — ONDANSETRON HCL 4 MG/2ML IJ SOLN: 4.0000 mg | INTRAMUSCULAR | Status: DC | PRN

## 2012-07-27 MED ORDER — PHENOL 1.4 % MT LIQD: 1.0000 | OROMUCOSAL | Status: DC | PRN

## 2012-07-27 MED ORDER — SODIUM CHLORIDE 0.9 % IJ SOLN: 3.0000 mL | INTRAMUSCULAR | Status: DC | PRN

## 2012-07-27 MED ORDER — LACTATED RINGERS IV SOLN: INTRAVENOUS | Status: DC

## 2012-07-27 MED ORDER — LEVOTHYROXINE SODIUM 112 MCG PO TABS
112.0000 ug | ORAL_TABLET | Freq: Every day | ORAL | Status: DC
  Administered 2012-07-28 – 2012-07-31 (×4): 112 ug via ORAL
  Filled 2012-07-27 (×6): qty 1

## 2012-07-27 MED ORDER — ONDANSETRON HCL 4 MG/2ML IJ SOLN
INTRAMUSCULAR | Status: DC | PRN
  Administered 2012-07-27: 4 mg via INTRAVENOUS

## 2012-07-27 MED ORDER — DEXAMETHASONE SODIUM PHOSPHATE 4 MG/ML IJ SOLN
4.0000 mg | Freq: Four times a day (QID) | INTRAMUSCULAR | Status: DC
  Administered 2012-07-28: 4 mg via INTRAVENOUS
  Filled 2012-07-27 (×19): qty 1

## 2012-07-27 MED ORDER — WARFARIN SODIUM 5 MG PO TABS
5.0000 mg | ORAL_TABLET | ORAL | Status: DC
  Administered 2012-07-28 – 2012-07-30 (×3): 5 mg via ORAL
  Filled 2012-07-27 (×3): qty 1

## 2012-07-27 MED ORDER — DEXAMETHASONE SODIUM PHOSPHATE 10 MG/ML IJ SOLN: 10.0000 mg | Freq: Once | INTRAMUSCULAR | Status: DC

## 2012-07-27 MED ORDER — FUROSEMIDE 40 MG PO TABS
40.0000 mg | ORAL_TABLET | ORAL | Status: DC
  Administered 2012-07-28 – 2012-07-31 (×2): 40 mg via ORAL
  Filled 2012-07-27 (×2): qty 1

## 2012-07-27 MED ORDER — CALCITRIOL 0.25 MCG PO CAPS
0.2500 ug | ORAL_CAPSULE | Freq: Every evening | ORAL | Status: DC
  Administered 2012-07-27 – 2012-07-31 (×5): 0.25 ug via ORAL
  Filled 2012-07-27 (×5): qty 1

## 2012-07-27 MED ORDER — PROPOFOL 10 MG/ML IV BOLUS
INTRAVENOUS | Status: DC | PRN
  Administered 2012-07-27: 200 mg via INTRAVENOUS

## 2012-07-27 MED ORDER — SIMVASTATIN 20 MG PO TABS
20.0000 mg | ORAL_TABLET | Freq: Every day | ORAL | Status: DC
  Administered 2012-07-27 – 2012-07-30 (×4): 20 mg via ORAL
  Filled 2012-07-27 (×5): qty 1

## 2012-07-27 MED ORDER — DEXAMETHASONE SODIUM PHOSPHATE 10 MG/ML IJ SOLN
INTRAMUSCULAR | Status: AC
  Filled 2012-07-27: qty 1

## 2012-07-27 MED ORDER — FENTANYL CITRATE 0.05 MG/ML IJ SOLN
INTRAMUSCULAR | Status: DC | PRN
  Administered 2012-07-27: 25 ug via INTRAVENOUS
  Administered 2012-07-27: 50 ug via INTRAVENOUS

## 2012-07-27 MED ORDER — WARFARIN - PHYSICIAN DOSING INPATIENT: Freq: Every day | Status: DC

## 2012-07-27 MED ORDER — CARVEDILOL 25 MG PO TABS
25.0000 mg | ORAL_TABLET | Freq: Two times a day (BID) | ORAL | Status: DC
  Administered 2012-07-28 – 2012-07-31 (×7): 25 mg via ORAL
  Filled 2012-07-27 (×10): qty 1

## 2012-07-27 MED ORDER — THROMBIN 5000 UNITS EX SOLR
CUTANEOUS | Status: AC
  Filled 2012-07-27: qty 5000

## 2012-07-27 MED ORDER — CARVEDILOL 25 MG PO TABS: 25.0000 mg | ORAL_TABLET | Freq: Two times a day (BID) | ORAL | Status: DC

## 2012-07-27 MED ORDER — BUPIVACAINE-EPINEPHRINE 0.25% -1:200000 IJ SOLN
INTRAMUSCULAR | Status: DC | PRN
  Administered 2012-07-27: 30 mL

## 2012-07-27 MED ORDER — EPHEDRINE SULFATE 50 MG/ML IJ SOLN
INTRAMUSCULAR | Status: DC | PRN
  Administered 2012-07-27 (×3): 10 mg via INTRAVENOUS

## 2012-07-27 MED ORDER — HYDROMORPHONE HCL PF 1 MG/ML IJ SOLN: 0.2500 mg | INTRAMUSCULAR | Status: DC | PRN

## 2012-07-27 MED ORDER — NEOSTIGMINE METHYLSULFATE 1 MG/ML IJ SOLN
INTRAMUSCULAR | Status: DC | PRN
  Administered 2012-07-27: 3 mg via INTRAVENOUS

## 2012-07-27 MED ORDER — ACETAMINOPHEN 10 MG/ML IV SOLN
1000.0000 mg | Freq: Four times a day (QID) | INTRAVENOUS | Status: AC
  Administered 2012-07-27 – 2012-07-28 (×3): 1000 mg via INTRAVENOUS
  Filled 2012-07-27 (×4): qty 100

## 2012-07-27 MED ORDER — METOCLOPRAMIDE HCL 5 MG/ML IJ SOLN: 10.0000 mg | Freq: Once | INTRAMUSCULAR | Status: DC | PRN

## 2012-07-27 MED ORDER — AMLODIPINE BESYLATE 5 MG PO TABS
5.0000 mg | ORAL_TABLET | Freq: Every day | ORAL | Status: DC
  Administered 2012-07-27 – 2012-07-30 (×4): 5 mg via ORAL
  Filled 2012-07-27 (×5): qty 1

## 2012-07-27 MED ORDER — TEMAZEPAM 15 MG PO CAPS: 15.0000 mg | ORAL_CAPSULE | Freq: Every evening | ORAL | Status: DC | PRN

## 2012-07-27 MED ORDER — PANTOPRAZOLE SODIUM 40 MG PO TBEC
40.0000 mg | DELAYED_RELEASE_TABLET | Freq: Every day | ORAL | Status: DC
  Administered 2012-07-28 – 2012-07-31 (×4): 40 mg via ORAL
  Filled 2012-07-27 (×4): qty 1

## 2012-07-27 MED ORDER — SODIUM CHLORIDE 0.9 % IV SOLN: 250.0000 mL | INTRAVENOUS | Status: DC

## 2012-07-27 MED ORDER — CEFAZOLIN SODIUM 1-5 GM-% IV SOLN
1.0000 g | Freq: Three times a day (TID) | INTRAVENOUS | Status: AC
  Administered 2012-07-27 – 2012-07-28 (×2): 1 g via INTRAVENOUS
  Filled 2012-07-27 (×3): qty 50

## 2012-07-27 MED ORDER — DEXAMETHASONE SODIUM PHOSPHATE 4 MG/ML IJ SOLN
INTRAMUSCULAR | Status: DC | PRN
  Administered 2012-07-27: 10 mg via INTRAVENOUS

## 2012-07-27 MED ORDER — LAMOTRIGINE 200 MG PO TABS
200.0000 mg | ORAL_TABLET | Freq: Every day | ORAL | Status: DC
  Administered 2012-07-27 – 2012-07-30 (×4): 200 mg via ORAL
  Filled 2012-07-27 (×5): qty 1

## 2012-07-27 MED ORDER — METHOCARBAMOL 100 MG/ML IJ SOLN
500.0000 mg | Freq: Four times a day (QID) | INTRAVENOUS | Status: DC | PRN
  Filled 2012-07-27: qty 5

## 2012-07-27 MED ORDER — THROMBIN 20000 UNITS EX KIT
PACK | CUTANEOUS | Status: DC | PRN
  Administered 2012-07-27: 20000 [IU] via TOPICAL

## 2012-07-27 MED ORDER — ARTIFICIAL TEARS OP OINT
TOPICAL_OINTMENT | OPHTHALMIC | Status: DC | PRN
  Administered 2012-07-27: 1 via OPHTHALMIC

## 2012-07-27 MED ORDER — LACTATED RINGERS IV SOLN
INTRAVENOUS | Status: DC | PRN
  Administered 2012-07-27: 15:00:00 via INTRAVENOUS

## 2012-07-27 MED ORDER — DEXAMETHASONE 4 MG PO TABS
4.0000 mg | ORAL_TABLET | Freq: Four times a day (QID) | ORAL | Status: DC
  Administered 2012-07-27 – 2012-07-31 (×14): 4 mg via ORAL
  Filled 2012-07-27 (×20): qty 1

## 2012-07-27 MED ORDER — OXYCODONE HCL 5 MG PO TABS: 5.0000 mg | ORAL_TABLET | Freq: Once | ORAL | Status: DC | PRN

## 2012-07-27 MED ORDER — LACTATED RINGERS IV SOLN
INTRAVENOUS | Status: DC
  Administered 2012-07-27: 19:00:00 via INTRAVENOUS

## 2012-07-27 MED ORDER — GLYCOPYRROLATE 0.2 MG/ML IJ SOLN
INTRAMUSCULAR | Status: DC | PRN
  Administered 2012-07-27: 0.2 mg via INTRAVENOUS
  Administered 2012-07-27: 0.4 mg via INTRAVENOUS

## 2012-07-27 MED ORDER — THROMBIN 20000 UNITS EX SOLR
CUTANEOUS | Status: AC
  Filled 2012-07-27: qty 20000

## 2012-07-27 MED ORDER — ACETAMINOPHEN 10 MG/ML IV SOLN
1000.0000 mg | Freq: Once | INTRAVENOUS | Status: AC
  Administered 2012-07-27: 1000 mg via INTRAVENOUS
  Filled 2012-07-27: qty 100

## 2012-07-27 MED ORDER — MENTHOL 3 MG MT LOZG: 1.0000 | LOZENGE | OROMUCOSAL | Status: DC | PRN

## 2012-07-27 MED ORDER — HEMOSTATIC AGENTS (NO CHARGE) OPTIME
TOPICAL | Status: DC | PRN
  Administered 2012-07-27: 1 via TOPICAL

## 2012-07-27 MED ORDER — SODIUM CHLORIDE 0.9 % IJ SOLN
3.0000 mL | Freq: Two times a day (BID) | INTRAMUSCULAR | Status: DC
  Administered 2012-07-28 – 2012-07-30 (×4): 3 mL via INTRAVENOUS

## 2012-07-27 MED ORDER — OXYCODONE HCL 5 MG/5ML PO SOLN: 5.0000 mg | Freq: Once | ORAL | Status: DC | PRN

## 2012-07-27 MED ORDER — ACETAMINOPHEN 10 MG/ML IV SOLN
INTRAVENOUS | Status: AC
  Filled 2012-07-27: qty 100

## 2012-07-27 MED ORDER — LIDOCAINE HCL (CARDIAC) 20 MG/ML IV SOLN
INTRAVENOUS | Status: DC | PRN
  Administered 2012-07-27: 100 mg via INTRAVENOUS

## 2012-07-27 MED ORDER — ROCURONIUM BROMIDE 100 MG/10ML IV SOLN
INTRAVENOUS | Status: DC | PRN
  Administered 2012-07-27: 50 mg via INTRAVENOUS
  Administered 2012-07-27: 10 mg via INTRAVENOUS

## 2012-07-27 SURGICAL SUPPLY — 54 items
BUR EGG ELITE 4.0 (BURR) ×2 IMPLANT
BUR MATCHSTICK NEURO 3.0 LAGG (BURR) ×2 IMPLANT
CANISTER SUCTION 2500CC (MISCELLANEOUS) ×2 IMPLANT
CLOTH BEACON ORANGE TIMEOUT ST (SAFETY) ×2 IMPLANT
CORDS BIPOLAR (ELECTRODE) ×2 IMPLANT
COVER SURGICAL LIGHT HANDLE (MISCELLANEOUS) ×2 IMPLANT
DERMABOND ADVANCED (GAUZE/BANDAGES/DRESSINGS) ×1
DERMABOND ADVANCED .7 DNX12 (GAUZE/BANDAGES/DRESSINGS) ×1 IMPLANT
DRAIN CHANNEL 15F RND FF W/TCR (WOUND CARE) ×2 IMPLANT
DRAPE POUCH INSTRU U-SHP 10X18 (DRAPES) ×2 IMPLANT
DRAPE SURG 17X23 STRL (DRAPES) ×2 IMPLANT
DRAPE U-SHAPE 47X51 STRL (DRAPES) ×2 IMPLANT
DRSG MEPILEX BORDER 4X8 (GAUZE/BANDAGES/DRESSINGS) ×2 IMPLANT
DURAPREP 26ML APPLICATOR (WOUND CARE) ×2 IMPLANT
ELECT BLADE 4.0 EZ CLEAN MEGAD (MISCELLANEOUS) ×2
ELECT CAUTERY BLADE 6.4 (BLADE) ×2 IMPLANT
ELECT REM PT RETURN 9FT ADLT (ELECTROSURGICAL) ×2
ELECTRODE BLDE 4.0 EZ CLN MEGD (MISCELLANEOUS) ×1 IMPLANT
ELECTRODE REM PT RTRN 9FT ADLT (ELECTROSURGICAL) ×1 IMPLANT
EVACUATOR 1/8 PVC DRAIN (DRAIN) IMPLANT
EVACUATOR SILICONE 100CC (DRAIN) ×2 IMPLANT
GLOVE BIOGEL PI IND STRL 6.5 (GLOVE) ×1 IMPLANT
GLOVE BIOGEL PI IND STRL 8.5 (GLOVE) ×1 IMPLANT
GLOVE BIOGEL PI INDICATOR 6.5 (GLOVE) ×1
GLOVE BIOGEL PI INDICATOR 8.5 (GLOVE) ×1
GLOVE ECLIPSE 6.0 STRL STRAW (GLOVE) ×2 IMPLANT
GLOVE ECLIPSE 8.5 STRL (GLOVE) ×2 IMPLANT
GOWN PREVENTION PLUS XXLARGE (GOWN DISPOSABLE) ×2 IMPLANT
GOWN STRL NON-REIN LRG LVL3 (GOWN DISPOSABLE) ×4 IMPLANT
KIT BASIN OR (CUSTOM PROCEDURE TRAY) ×2 IMPLANT
KIT ROOM TURNOVER OR (KITS) ×2 IMPLANT
NEEDLE 22X1 1/2 (OR ONLY) (NEEDLE) ×2 IMPLANT
NEEDLE SPNL 18GX3.5 QUINCKE PK (NEEDLE) ×4 IMPLANT
NS IRRIG 1000ML POUR BTL (IV SOLUTION) ×2 IMPLANT
PACK LAMINECTOMY ORTHO (CUSTOM PROCEDURE TRAY) ×2 IMPLANT
PACK UNIVERSAL I (CUSTOM PROCEDURE TRAY) ×2 IMPLANT
PAD ARMBOARD 7.5X6 YLW CONV (MISCELLANEOUS) ×4 IMPLANT
PATTIES SURGICAL .5 X.5 (GAUZE/BANDAGES/DRESSINGS) IMPLANT
PATTIES SURGICAL .5 X1 (DISPOSABLE) ×2 IMPLANT
SPONGE SURGIFOAM ABS GEL 100 (HEMOSTASIS) IMPLANT
STRIP CLOSURE SKIN 1/2X4 (GAUZE/BANDAGES/DRESSINGS) ×2 IMPLANT
SURGIFLO TRUKIT (HEMOSTASIS) ×2 IMPLANT
SUT MNCRL AB 3-0 PS2 18 (SUTURE) ×2 IMPLANT
SUT VIC AB 0 CT1 27 (SUTURE) ×1
SUT VIC AB 0 CT1 27XBRD ANBCTR (SUTURE) ×1 IMPLANT
SUT VIC AB 1 CTX 36 (SUTURE) ×2
SUT VIC AB 1 CTX36XBRD ANBCTR (SUTURE) ×2 IMPLANT
SUT VIC AB 2-0 CT1 18 (SUTURE) ×2 IMPLANT
SYR BULB IRRIGATION 50ML (SYRINGE) ×2 IMPLANT
SYR CONTROL 10ML LL (SYRINGE) ×2 IMPLANT
TOWEL OR 17X24 6PK STRL BLUE (TOWEL DISPOSABLE) ×2 IMPLANT
TOWEL OR 17X26 10 PK STRL BLUE (TOWEL DISPOSABLE) ×2 IMPLANT
WATER STERILE IRR 1000ML POUR (IV SOLUTION) ×2 IMPLANT
YANKAUER SUCT BULB TIP NO VENT (SUCTIONS) ×2 IMPLANT

## 2012-07-27 NOTE — Anesthesia Procedure Notes (Signed)
Procedure Name: Intubation Date/Time: 07/27/2012 12:38 PM Performed by: Arlice Colt B Pre-anesthesia Checklist: Patient identified, Emergency Drugs available, Suction available, Patient being monitored and Timeout performed Patient Re-evaluated:Patient Re-evaluated prior to inductionOxygen Delivery Method: Circle system utilized Preoxygenation: Pre-oxygenation with 100% oxygen Intubation Type: IV induction Ventilation: Mask ventilation without difficulty Laryngoscope Size: Mac and 3 Grade View: Grade I Tube size: 7.5 mm Number of attempts: 1 Airway Equipment and Method: Stylet Placement Confirmation: ETT inserted through vocal cords under direct vision,  positive ETCO2 and breath sounds checked- equal and bilateral Secured at: 23 cm Tube secured with: Tape Dental Injury: Teeth and Oropharynx as per pre-operative assessment

## 2012-07-27 NOTE — Brief Op Note (Signed)
07/27/2012  3:56 PM  PATIENT:  Toniann Ket  78 y.o. male  PRE-OPERATIVE DIAGNOSIS:  Lumbar Degenrative Disc Disease  POST-OPERATIVE DIAGNOSIS:  Lumbar Degenrative Disc Disease  PROCEDURE:  Procedure(s) (LRB) with comments: LUMBAR LAMINECTOMY/DECOMPRESSION MICRODISCECTOMY 1 LEVEL (Right) - L4 - L5 Discectomy on the Right  SURGEON:  Surgeon(s) and Role:    * Venita Lick, MD - Primary  PHYSICIAN ASSISTANT:   ASSISTANTS: none   ANESTHESIA:   none  EBL:  Total I/O In: 500 [I.V.:500] Out: -   BLOOD ADMINISTERED:none  DRAINS: none   LOCAL MEDICATIONS USED:  MARCAINE     SPECIMEN:  No Specimen  DISPOSITION OF SPECIMEN:  N/A  COUNTS:  YES  TOURNIQUET:  * No tourniquets in log *  DICTATION: .Other Dictation: Dictation Number [id number]  PLAN OF CARE: Admit to inpatient   PATIENT DISPOSITION:  PACU - hemodynamically stable.

## 2012-07-27 NOTE — H&P (Signed)
No change in clinical exam H+P reviewed  

## 2012-07-27 NOTE — Transfer of Care (Signed)
Immediate Anesthesia Transfer of Care Note  Patient: Tim Ponce  Procedure(s) Performed: Procedure(s) (LRB) with comments: LUMBAR LAMINECTOMY/DECOMPRESSION MICRODISCECTOMY 1 LEVEL (Right) - L4 - L5 Discectomy on the Right  Patient Location: PACU  Anesthesia Type:General  Level of Consciousness: awake, alert  and patient cooperative  Airway & Oxygen Therapy: Patient Spontanous Breathing  Post-op Assessment: Report given to PACU RN and Post -op Vital signs reviewed and stable  Post vital signs: Reviewed and stable  Complications: No apparent anesthesia complications

## 2012-07-27 NOTE — Plan of Care (Signed)
Problem: Consults Goal: Diagnosis - Spinal Surgery L4-5 Discectomy

## 2012-07-27 NOTE — Anesthesia Preprocedure Evaluation (Signed)
Anesthesia Evaluation  Patient identified by MRN, date of birth, ID band Patient awake    Reviewed: Allergy & Precautions, H&P , NPO status , Patient's Chart, lab work & pertinent test results, reviewed documented beta blocker date and time   Airway Mallampati: II TM Distance: >3 FB Neck ROM: full    Dental   Pulmonary sleep apnea ,  breath sounds clear to auscultation        Cardiovascular hypertension, On Medications and On Home Beta Blockers + CAD + dysrhythmias Atrial Fibrillation Rhythm:regular     Neuro/Psych  Neuromuscular disease negative psych ROS   GI/Hepatic Neg liver ROS, hiatal hernia, GERD-  Medicated,  Endo/Other  Hypothyroidism   Renal/GU CRFRenal disease  negative genitourinary   Musculoskeletal   Abdominal   Peds  Hematology negative hematology ROS (+)   Anesthesia Other Findings See surgeon's H&P   Reproductive/Obstetrics negative OB ROS                           Anesthesia Physical Anesthesia Plan  ASA: III  Anesthesia Plan: General   Post-op Pain Management:    Induction: Intravenous  Airway Management Planned: Oral ETT  Additional Equipment:   Intra-op Plan:   Post-operative Plan: Extubation in OR  Informed Consent: I have reviewed the patients History and Physical, chart, labs and discussed the procedure including the risks, benefits and alternatives for the proposed anesthesia with the patient or authorized representative who has indicated his/her understanding and acceptance.   Dental Advisory Given  Plan Discussed with: CRNA and Surgeon  Anesthesia Plan Comments:         Anesthesia Quick Evaluation

## 2012-07-27 NOTE — Anesthesia Postprocedure Evaluation (Signed)
  Anesthesia Post-op Note  Patient: Tim Ponce  Procedure(s) Performed: Procedure(s) (LRB) with comments: LUMBAR LAMINECTOMY/DECOMPRESSION MICRODISCECTOMY 1 LEVEL (Right) - L4 - L5 Discectomy on the Right  Patient Location: PACU  Anesthesia Type:General  Level of Consciousness: awake  Airway and Oxygen Therapy: Patient Spontanous Breathing  Post-op Pain: mild  Post-op Assessment: Post-op Vital signs reviewed  Post-op Vital Signs: Reviewed  Complications: No apparent anesthesia complications

## 2012-07-28 ENCOUNTER — Encounter (HOSPITAL_COMMUNITY): Payer: Self-pay | Admitting: Orthopedic Surgery

## 2012-07-28 NOTE — Evaluation (Signed)
G code entered I agree with the following treatment note after reviewing documentation.   Harrel Carina [place]   OTR/L Pager: [phone] Office: [phone] .

## 2012-07-28 NOTE — Evaluation (Signed)
Physical Therapy Evaluation Patient Details Name: Tim Ponce MRN: [id number] DOB: 1930-11-11 Today's Date: 07/28/2012 Time: 8295-6213 PT Time Calculation (min): 18 min  PT Assessment / Plan / Recommendation Clinical Impression  Patient is a 78 yo male s/p L4-5 laminectomy and discectomy.  Patient does not have 24 hour assist available.  Recommend ST-SNF for continued therapy at discharge.  Patient will benefit from acute PT to maximize independence and prepare patient for therapy at next venue of care.    PT Assessment  Patient needs continued PT services    Follow Up Recommendations  SNF    Does the patient have the potential to tolerate intense rehabilitation      Barriers to Discharge Decreased caregiver support Does not have 24 hour assist available.    Equipment Recommendations   (TBD post-acute)    Recommendations for Other Services     Frequency Min 5X/week    Precautions / Restrictions Precautions Precautions: Back Precaution Booklet Issued: Yes (comment) Precaution Comments: Reviewed back precautions with patient. Able to recall 3/3 from am OT session.  Needed education on impact on mobility. Required Braces or Orthoses: Spinal Brace Spinal Brace: Lumbar corset;Applied in sitting position (Order - patient may be up for PT today without brace.) Restrictions Weight Bearing Restrictions: No   Pertinent Vitals/Pain       Mobility  Transfers Transfers: Sit to Stand;Stand to Sit Sit to Stand: 4: Min guard;With upper extremity assist;With armrests;From chair/3-in-1 Stand to Sit: 4: Min guard;With upper extremity assist;With armrests;To chair/3-in-1 Details for Transfer Assistance: Verbal cues for technique.  Patient maintaining back precautions during transfers. Ambulation/Gait Ambulation/Gait Assistance: 4: Min guard Ambulation Distance (Feet): 86 Feet Assistive device: Rolling walker Ambulation/Gait Assistance Details: Verbal cues to stand upright, stay  close to RW, and look up during gait. Gait Pattern: Step-through pattern;Decreased stride length Gait velocity: Slow gait speed General Gait Details: Corsett not delivered to room yet.           PT Diagnosis: Difficulty walking;Abnormality of gait  PT Problem List: Decreased activity tolerance;Decreased balance;Decreased mobility;Decreased knowledge of use of DME;Decreased knowledge of precautions PT Treatment Interventions: DME instruction;Gait training;Functional mobility training;Patient/family education   PT Goals Acute Rehab PT Goals PT Goal Formulation: With patient Time For Goal Achievement: 08/11/12 Potential to Achieve Goals: Good Pt will Roll Supine to Right Side: Independently PT Goal: Rolling Supine to Right Side - Progress: Goal set today Pt will Roll Supine to Left Side: Independently PT Goal: Rolling Supine to Left Side - Progress: Goal set today Pt will go Supine/Side to Sit: Independently;with HOB 0 degrees PT Goal: Supine/Side to Sit - Progress: Goal set today Pt will go Sit to Supine/Side: Independently;with HOB 0 degrees PT Goal: Sit to Supine/Side - Progress: Goal set today Pt will go Sit to Stand: with modified independence;with upper extremity assist PT Goal: Sit to Stand - Progress: Goal set today Pt will go Stand to Sit: with modified independence;with upper extremity assist PT Goal: Stand to Sit - Progress: Goal set today Pt will Ambulate: >150 feet;with modified independence;with least restrictive assistive device PT Goal: Ambulate - Progress: Goal set today Additional Goals Additional Goal #1: Patient will recall 3/3 back precautions and integrate into functional mobility. PT Goal: Additional Goal #1 - Progress: Goal set today  Visit Information  Last PT Received On: 07/28/12 Assistance Needed: +1    Subjective Data  Subjective: "I've not had any pain since the surgery" Patient Stated Goal: To get stronger so that I can  eventually go home with my  son.   Prior Functioning  Home Living Lives With: Son Available Help at Discharge: Skilled Nursing Facility Home Adaptive Equipment: None Prior Function Level of Independence: Independent Able to Take Stairs?: Yes Driving: Yes Vocation: Retired Musician: No difficulties Dominant Hand: Right    Cognition  Overall Cognitive Status: Appears within functional limits for tasks assessed/performed Arousal/Alertness: Awake/alert Orientation Level: Oriented X4 / Intact Behavior During Session: [facility] for tasks performed    Extremity/Trunk Assessment Right Upper Extremity Assessment RUE ROM/Strength/Tone: WFL for tasks assessed RUE Sensation: WFL - Light Touch Left Upper Extremity Assessment LUE ROM/Strength/Tone: WFL for tasks assessed LUE Sensation: WFL - Light Touch Right Lower Extremity Assessment RLE ROM/Strength/Tone: WFL for tasks assessed (Slightly weak DF) RLE Sensation: WFL - Light Touch Left Lower Extremity Assessment LLE ROM/Strength/Tone: WFL for tasks assessed (slightly weak DF) LLE Sensation: WFL - Light Touch Trunk Assessment Trunk Assessment: Normal   Balance    End of Session PT - End of Session Equipment Utilized During Treatment: Gait belt Activity Tolerance: Patient tolerated treatment well Patient left: in chair;with call bell/phone within reach Nurse Communication: Mobility status (continue to wait for brace)  GP Functional Assessment Tool Used: Clinical judgement Functional Limitation: Mobility: Walking and moving around Mobility: Walking and Moving Around Current Status ([id number]): At least 20 percent but less than 40 percent impaired, limited or restricted Mobility: Walking and Moving Around Goal Status [phone]): At least 1 percent but less than 20 percent impaired, limited or restricted   Vena Austria 07/28/2012, 4:55 PM Durenda Hurt. Renaldo Fiddler, [facility] Acute Rehab Services Pager [phone]

## 2012-07-28 NOTE — Progress Notes (Signed)
Utilization review completed. Keylani Perlstein, RN, BSN. 

## 2012-07-28 NOTE — Progress Notes (Signed)
CARE MANAGEMENT NOTE 07/28/2012  Patient:  REFAEL, BERGARA   Account Number:  [id number]  Date Initiated:  07/28/2012  Documentation initiated by:  Vance Peper  Subjective/Objective Assessment:   78 yr old male s/p L5-Microdiskectomy and laminectomy     Action/Plan:   Patient is for shortterm rehab at [facility]. Social Worker aware.   Anticipated DC Date:  07/31/2012   Anticipated DC Plan:  SKILLED NURSING FACILITY  In-house referral  Clinical Social Worker      DC Planning Services  CM consult      [facility] Choice  NA   Choice offered to / List presented to:             Status of service:  Completed, signed off Medicare Important Message given?   (If response is "NO", the following Medicare IM given date fields will be blank) Date Medicare IM given:   Date Additional Medicare IM given:    Discharge Disposition:  SKILLED NURSING FACILITY  Per UR Regulation:    If discussed at Long Length of Stay Meetings, dates discussed:    Comments:

## 2012-07-28 NOTE — Progress Notes (Signed)
     Subjective: Procedure(s) (LRB): LUMBAR LAMINECTOMY/DECOMPRESSION MICRODISCECTOMY 1 LEVEL (Right) 1 Day Post-Op  Patient reports pain as 2 on 0-10 scale.  Reports decreased leg pain denies incisional back pain   Positive void Negative bowel movement Positive flatus Negative chest pain or shortness of breath  Objective: Vital signs in last 24 hours: Temp:  [97 F (36.1 C)-98.2 F (36.8 C)] 98.2 F (36.8 C) (11/08 0920) Pulse Rate:  [54-92] 92  (11/08 0920) Resp:  [16-18] 18  (11/08 0920) BP: (122-139)/(67-82) 128/82 mmHg (11/08 0920) SpO2:  [97 %-99 %] 97 % (11/08 0920)  Intake/Output from previous day: 11/07 0701 - 11/08 0700 In: 940 [P.O.:240; I.V.:700] Out: 1550 [Urine:1550]  Labs:  [facility] 07/26/12 1442  WBC 7.0  RBC 3.78*  HCT 35.8*  PLT 163    Basename 07/26/12 1442  NA 141  K 4.3  CL 106  CO2 26  BUN 50*  CREATININE 2.82*  GLUCOSE 74  CALCIUM 8.9    Basename 07/28/12 0445 07/27/12 1108  LABPT -- --  INR 1.64* 1.78*    Physical Exam: Neurologically intact ABD soft Neurovascular intact Intact pulses distally Incision: dressing C/D/I and no drainage Compartment soft  Assessment/Plan: Patient stable  xrays n/a  Continue mobilization with physical therapy Continue care  Discharge to SNF Patient lives alone and I have concerns about d/c to home with limited social support.  Recommend d/c to [facility].  Will start process for SNF placement  Venita Lick, MD [facility] [phone]

## 2012-07-28 NOTE — Progress Notes (Signed)
Orthopedic Tech Progress Note Patient Details:  Tim Ponce 10/24/1930 [id number] Called in brace order at 4:44 pm. Patient ID: Tim Ponce, male   DOB: March 06, 1931, 78 y.o.   MRN: [id number]   [name] 07/28/2012, 4:44 PM

## 2012-07-28 NOTE — Progress Notes (Signed)
Orthopedic Tech Progress Note Patient Details:  FIRAS GUARDADO 10-30-1930 [id number]  Patient ID: Toniann Ket, male   DOB: March 16, 1931, 78 y.o.   MRN: [id number]   Shawnie Pons 07/28/2012, 5:54 PM L-S SUPPORT COMPETED BY [employer]

## 2012-07-28 NOTE — Evaluation (Signed)
Occupational Therapy Evaluation Patient Details Name: Tim Ponce MRN: [id number] DOB: [birth year] Today's Date: 07/28/2012 Time: 4098-1191 OT Time Calculation (min): 37 min  OT Assessment / Plan / Recommendation Clinical Impression  Pt is a pleasant 78 yo male s/p lumbar laminectomy/decompression microdiscectomy. Plans to get further rehab at SNF prior to d/c home to reduce the burden on his son. Would benefit from skilled OT acutely to increase independence with ADL's     OT Assessment  Patient needs continued OT Services    Follow Up Recommendations  SNF    Barriers to Discharge      Equipment Recommendations  None recommended by OT    Recommendations for Other Services    Frequency  Min 2X/week    Precautions / Restrictions Precautions Precautions: Back Precaution Booklet Issued: Yes (comment) Precaution Comments: Educated on precuations  Restrictions Weight Bearing Restrictions: No   Pertinent Vitals/Pain Pt reports no pain or discomfort     ADL  Grooming: Performed;Teeth care;Supervision/safety Where Assessed - Grooming: Unsupported standing Toilet Transfer: Simulated;Min guard Toilet Transfer Method: Sit to Barista: Other (comment) (bed to chair) Equipment Used: Gait belt;Rolling walker Transfers/Ambulation Related to ADLs: Min guard during sit to stand for balance and supervision during stand to sit for safety. Ambulates with supervision in room. ADL Comments: Educated pt on back precautions and gave handout. Pt is supervision for bed mobility with vc's for sequencing. Pt ambulated to bathroom to brush teeth at supervision level. Pt educated on AE to (A) with LB bathing and dressing. Able to recall 2/3 precautions at end of session.      OT Diagnosis: Generalized weakness;Acute pain  OT Problem List: Decreased safety awareness;Decreased knowledge of use of DME or AE;Decreased knowledge of precautions;Pain;Decreased activity tolerance OT  Treatment Interventions: Self-care/ADL training;Therapeutic exercise;DME and/or AE instruction;Therapeutic activities;Patient/family education   OT Goals Acute Rehab OT Goals OT Goal Formulation: With patient Time For Goal Achievement: 08/11/12 Potential to Achieve Goals: Good ADL Goals Pt Will Perform Upper Body Dressing: with supervision;Sitting, chair;Sitting, bed;Unsupported ADL Goal: Upper Body Dressing - Progress: Goal set today Pt Will Perform Lower Body Dressing: with supervision;Sit to stand from chair;Sit to stand from bed;with adaptive equipment ADL Goal: Lower Body Dressing - Progress: Goal set today Pt Will Transfer to Toilet: with supervision;Ambulation;Raised toilet seat with arms;Maintaining back safety precautions ADL Goal: Toilet Transfer - Progress: Goal set today Miscellaneous OT Goals Miscellaneous OT Goal #1: Pt will recall 3/3 precautions as precursor to completing ADL's OT Goal: Miscellaneous Goal #1 - Progress: Goal set today  Visit Information  Last OT Received On: 07/28/12 Assistance Needed: +1    Subjective Data  Subjective: I will do whatever you tell me I need to do Patient Stated Goal: To get more rehab before going home    Prior Functioning     Home Living Lives With: Son Available Help at Discharge: Skilled Nursing Facility Type of Home: Other (Comment) (town house ) Home Access: Stairs to enter Secretary/administrator of Steps: 2 Entrance Stairs-Rails: None Home Layout: Two level;Bed/bath upstairs Alternate Level Stairs-Number of Steps: 13 Alternate Level Stairs-Rails: Right Bathroom Shower/Tub: Tub/shower unit;Curtain Bathroom Toilet: Handicapped height Bathroom Accessibility: No Home Adaptive Equipment: None Prior Function Level of Independence: Independent Able to Take Stairs?: Yes Driving: Yes Vocation: Retired Musician: No difficulties Dominant Hand: Right         Vision/Perception     Cognition   Overall Cognitive Status: Appears within functional limits for tasks assessed/performed Arousal/Alertness: Awake/alert  Orientation Level: Appears intact for tasks assessed Behavior During Session: [facility] for tasks performed    Extremity/Trunk Assessment Right Upper Extremity Assessment RUE ROM/Strength/Tone: [facility] for tasks assessed Left Upper Extremity Assessment LUE ROM/Strength/Tone: WFL for tasks assessed     Mobility Bed Mobility Bed Mobility: Rolling Right;Right Sidelying to Sit;Scooting to HOB Rolling Right: 5: Supervision;4: Min assist;With rail Right Sidelying to Sit: 5: Supervision;HOB flat Scooting to HOB: 5: Supervision Details for Bed Mobility Assistance: Cues for log roll technique  Transfers Transfers: Sit to Stand;Stand to Sit Sit to Stand: 4: Min guard;With upper extremity assist;From bed Stand to Sit: 5: Supervision;With upper extremity assist;To chair/3-in-1;With armrests Details for Transfer Assistance: min guard during sit to stand due to first time OOB since surgery.                     End of Session OT - End of Session Equipment Utilized During Treatment: Gait belt Activity Tolerance: Patient tolerated treatment well Patient left: in chair;with call bell/phone within reach Nurse Communication: Mobility status  GO     Cleora Fleet 07/28/2012, 10:33 AM

## 2012-07-28 NOTE — Progress Notes (Signed)
Nutrition Brief Note  Patient identified on the Malnutrition Screening Tool (MST) Report  Current wt: 208 lbs  Current diet order is Regular, patient is consuming approximately 75% of meals at this time per pt report. Labs and medications reviewed.   Pt reports that he moved to [place] 3 years ago and lost 40 lbs of intentional wt by changing his eating habits. Pt denies nutrition-related concerns, states he is currently eating well with an increased appetite.No nutrition interventions warranted at this time. If nutrition issues arise, please consult RD. RD notes pt is planning to d/c to SNF.  Loyce Dys, MS RD LDN Clinical Inpatient Dietitian Pager: [phone] Weekend/After hours pager: [phone]

## 2012-07-28 NOTE — Op Note (Signed)
NAME:  Tim Ponce, Tim Ponce NO.:  [id number]  MEDICAL RECORD NO.:  [id number]  LOCATION:  5N25C                        FACILITY:  [facility]  PHYSICIAN:  Alvy Beal, MD    DATE OF BIRTH:  05/21/1931  DATE OF PROCEDURE: DATE OF DISCHARGE:                              OPERATIVE REPORT   PREOPERATIVE DIAGNOSIS:  L4-5 disk herniation with extruded free fragment behind the L5 vertebral body.  POSTOPERATIVE DIAGNOSIS:  L4-5 disk herniation with extruded free fragment behind the L5 vertebral body.  OPERATIVE PROCEDURE:  Laminectomy L4-5 right side with diskectomy L4-5.  COMPLICATIONS:  None.  CONDITION:  Stable.  HISTORY:  This is a very pleasant 78 year old gentleman who presented to my office with acute onset of severe back, buttock, and leg pain.  The patient had a foot drop with focal neurological deficits in the L5 distribution.  After discussing treatment options, the patient had a transforaminal epidural injection which provided no significant relief. Because of the progressive pain and neurological deficits, we elected to proceed with surgery.  All appropriate risks, benefits, and alternatives were discussed and consent was obtained.  OPERATIVE NOTE:  The patient was brought to the operating room, placed supine on the operating table.  After successful induction of general anesthesia and endotracheal intubation, TED, SCDs were applied.  He was turned prone onto the Wilson frame and all bony prominences were well padded.  Back was then prepped and draped in standard fashion.  Time-out was done confirming patient, procedure, and all other pertinent important data.  Once that was completed, I then placed two 18-gauge spinal needles into the wound and took an intraoperative x-ray.  This localized my incision site.  I then infiltrated this area with 0.25% Marcaine and then made a midline incision approximately an inch and a half in length.  Sharp dissection was  carried out down to the deep fascia, and incised the deep fascia on the right-hand side.  I then dissected down to expose the lamina of L4 and L5.  Once I had done this, I then placed a Penfield 4 underneath the L4 lamina and took an x-ray to confirm that I was at the appropriate level.  Once this was done, I then used a small curved curette to develop a plane underneath the lamina of L4.  Because of the cranial extent to the disk fragment, I then performed a very generous right-sided laminotomy.  This was almost near complete laminectomy on that right side.  At this point, I then dissected into the lateral gutter.  I then identified the L4-5 disk space, the traversing L5 nerve root, and the exiting L4 nerve root.  I then coagulated the epidural veins with the bipolar electrocautery, and then incised the L4-5 disk space.  Using a micro pituitary rongeur, I removed the disk fragments that were still at the level of the disk space.  I then took a nerve hook, and swept it superiorly and identified the small osseous crater that the disk fragment had herniated up and into behind the body of L4.  This also was resected.  I then took an x- ray confirming that I was at the appropriate  level.  Once I had reconfirmed, I was at the appropriate level.  I had removed fragments of disk material consistent with what was seen on the preoperative MRI.  I then swept with my [facility] along the lateral recess palpating from the L4 pedicle down to the L5 pedicle.  I then palpated centrally below the L4-5 disk space at the disk space and above it.  There was no further neural tension and an adequate decompression.  At this point, I irrigated copiously with normal saline, obtained hemostasis using bipolar electrocautery.  I then placed a thrombin-soaked Gelfoam patty over the exposed laminectomy.  I then closed the wound in a layered fashion with interrupted #1 Vicryl sutures, 2-0 Vicryl suture, and 3-0  Monocryl.  Steri-Strips and dry dressing were applied.  The patient was then extubated and transferred to PACU without incident.  At the end of the case, all needle and sponge counts were correct.  There were no adverse intraoperative events.     Alvy Beal, MD     DDB/MEDQ  D:  07/27/2012  T:  07/28/2012  Job:  [id number]

## 2012-07-29 LAB — PROTIME-INR: Prothrombin Time: 21.4 seconds — ABNORMAL HIGH (ref 11.6–15.2)

## 2012-07-29 NOTE — Progress Notes (Signed)
Physical Therapy Treatment Patient Details Name: Tim Ponce MRN: [id number] DOB: [birth year] Today's Date: 07/29/2012 Time: 0949-1000 PT Time Calculation (min): 11 min  PT Assessment / Plan / Recommendation Comments on Treatment Session  Patient progressing towards goal. Awating SNF as he does not have assistance during the day to help with functional activities and ADLs    Follow Up Recommendations  SNF     Does the patient have the potential to tolerate intense rehabilitation     Barriers to Discharge        Equipment Recommendations       Recommendations for Other Services    Frequency Min 5X/week   Plan Discharge plan remains appropriate;Frequency remains appropriate    Precautions / Restrictions Precautions Precautions: Back Precaution Comments: Patient able to recall all precautions Required Braces or Orthoses: Spinal Brace Spinal Brace: Lumbar corset;Applied in sitting position   Pertinent Vitals/Pain     Mobility  Bed Mobility Bed Mobility: Supine to Sit;Sit to Supine Rolling Right: 5: Supervision Right Sidelying to Sit: 5: Supervision Sit to Supine: 5: Supervision Scooting to [facility]: 5: Supervision Details for Bed Mobility Assistance: Cues to ensure no twisting Transfers Sit to Stand: 5: Supervision;With upper extremity assist;From bed Stand to Sit: 5: Supervision;With upper extremity assist;To bed Ambulation/Gait Ambulation/Gait Assistance: 5: Supervision Ambulation Distance (Feet): 400 Feet Assistive device: Rolling walker Ambulation/Gait Assistance Details: Cues for tall posture and to keep RW closer to his body Gait Pattern: Step-through pattern;Trunk flexed    Exercises     PT Diagnosis:    PT Problem List:   PT Treatment Interventions:     PT Goals Acute Rehab PT Goals PT Goal: Rolling Supine to Right Side - Progress: Progressing toward goal PT Goal: Rolling Supine to Left Side - Progress: Progressing toward goal PT Goal: Supine/Side to Sit  - Progress: Progressing toward goal PT Goal: Sit to Supine/Side - Progress: Progressing toward goal PT Goal: Sit to Stand - Progress: Progressing toward goal PT Goal: Stand to Sit - Progress: Progressing toward goal PT Goal: Ambulate - Progress: Progressing toward goal Additional Goals PT Goal: Additional Goal #1 - Progress: Progressing toward goal  Visit Information  Last PT Received On: 07/29/12 Assistance Needed: +1    Subjective Data      Cognition  Overall Cognitive Status: Appears within functional limits for tasks assessed/performed Arousal/Alertness: Awake/alert Orientation Level: Appears intact for tasks assessed Behavior During Session: [facility] for tasks performed    Balance     End of Session PT - End of Session Equipment Utilized During Treatment: Gait belt;Back brace Activity Tolerance: Patient tolerated treatment well Patient left: in bed;with call bell/phone within reach Nurse Communication: Mobility status   GP     Fredrich Birks 07/29/2012, 11:24 AM 07/29/2012 Fredrich Birks PTA [phone] pager [phone] office

## 2012-07-29 NOTE — Progress Notes (Signed)
Patient resting comfortably. Patient did not wake during the night unless patient was woken up. Denying pain. No medication given.

## 2012-07-29 NOTE — Progress Notes (Signed)
    Subjective: Procedure(s) (LRB): LUMBAR LAMINECTOMY/DECOMPRESSION MICRODISCECTOMY 1 LEVEL (Right) 2 Days Post-Op  Patient reports pain as 2 on 0-10 scale.  Reports decreased leg pain reports incisional back pain   Positive void Negative bowel movement Positive flatus Negative chest pain or shortness of breath  Objective: Vital signs in last 24 hours: Temp:  [98.1 F (36.7 C)-98.3 F (36.8 C)] 98.3 F (36.8 C) (11/09 0503) Pulse Rate:  [55-92] 55  (11/09 0503) Resp:  [16-18] 16  (11/09 0503) BP: (116-147)/(58-82) 123/58 mmHg (11/09 0503) SpO2:  [93 %-98 %] 93 % (11/09 0503)  Intake/Output from previous day: 11/08 0701 - 11/09 0700 In: 3126 [P.O.:1080; I.V.:2046] Out: 2075 [Urine:2075]  Labs:  [facility] 07/26/12 1442  WBC 7.0  RBC 3.78*  HCT 35.8*  PLT 163    Basename 07/26/12 1442  NA 141  K 4.3  CL 106  CO2 26  BUN 50*  CREATININE 2.82*  GLUCOSE 74  CALCIUM 8.9    Basename 07/28/12 0445 07/27/12 1108  LABPT -- --  INR 1.64* 1.78*    Physical Exam: Neurologically intact ABD soft Neurovascular intact Incision: dressing C/D/I and no drainage No cellulitis present Compartment soft  Assessment/Plan: Patient stable  Continue mobilization with physical therapy Continue care  Discharge to SNF - awaiting d/c to [facility] Monday Doing well - improved EHL/TA strength compared to pre-op.  Foot drop slowly improving  Venita Lick, MD [facility] [phone]

## 2012-07-30 LAB — PROTIME-INR: INR: 2.25 — ABNORMAL HIGH (ref 0.00–1.49)

## 2012-07-30 NOTE — Progress Notes (Signed)
Tim Ponce  MRN: [id number] DOB/Age: 1930/12/28 78 y.o. Physician: Lynnea Maizes, M.D. 3 Days Post-Op Procedure(s) (LRB): LUMBAR LAMINECTOMY/DECOMPRESSION MICRODISCECTOMY 1 LEVEL (Right)  Subjective: I've had no pain since surgery. Vital Signs Temp:  [97.5 F (36.4 C)-98.3 F (36.8 C)] 98.3 F (36.8 C) (11/10 0500) Pulse Rate:  [54-57] 57  (11/10 0500) Resp:  [16] 16  (11/10 0500) BP: (132-149)/(65-70) 135/65 mmHg (11/10 0500) SpO2:  [94 %-97 %] 94 % (11/10 0500)  Lab Results No results found for this basename: WBC:2,HGB:2,HCT:2,PLT:2 in the last 72 hours BMET No results found for this basename: NA:2,K:2,CL:2,CO2:2,GLUCOSE:2,BUN:2,CREATININE:2,CALCIUM:2 in the last 72 hours INR  Date Value Range Status  07/30/2012 2.25* 0.00 - 1.49 Final     Exam  Easily moves from supi9ne to sitting, wearing brace. Excellent ankle and great toe dorsiflexion.  Plan Progressing well. Wants to consider d/c home vs SNF tomorrow [name] M 07/30/2012, 10:53 AM

## 2012-07-30 NOTE — Progress Notes (Signed)
Clinical Social Work Department BRIEF PSYCHOSOCIAL ASSESSMENT 07/30/2012  Patient:  Tim Ponce, Tim Ponce     Account Number:  [id number]     Admit date:  07/27/2012  Clinical Social Worker:  Skip Mayer  Date/Time:  07/30/2012 12:00 M  Referred by:    Date Referred:  07/30/2012 Referred for  SNF Placement   Other Referral:   Interview type:  Patient Other interview type:    PSYCHOSOCIAL DATA Living Status:  FAMILY Admitted from facility:   Level of care:   Primary support name:  Reuel Boom Primary support relationship to patient:  CHILD, ADULT Degree of support available:   Adequate, per pt    CURRENT CONCERNS Current Concerns  Post-Acute Placement   Other Concerns:    SOCIAL WORK ASSESSMENT / PLAN CSW consulted for SNF.  Met with pt who reports admitted from home where he lives with his son who works normal business hours, but is available evenings.  Pt also reports his son is able to check on him during lunch.  CSW explained placement process with [facility] and answered questions.  Pt currently feels safe to d/c home instead of SNF.  Pt will not be authorized for SNF by [facility] due to current ability to ambulate over 400 ft at supervision.  CSW signing off as no other CSW needs identified at this time.  Please call weekday CSW with any questions or if new CSW needs arise.   Assessment/plan status:  Information/Referral to [facility] Other assessment/ plan:   Information/referral to community resources:   SNF    PATIENT'S/FAMILY'S RESPONSE TO PLAN OF CARE: Pt reports he feels safe to d/c home with HH at current functional level.  Pt's son available in evenings and able to check on pt during lunch.  Pt also reports neighbors who are able to check on him during the day while his son is at work.  Pt verbalized understanding of current d/c plan and appreciation for CSW visit.        Dellie Burns, MSW, LCSWA [phone] (Weekends  8:00am-4:30pm)

## 2012-07-31 LAB — PROTIME-INR
INR: 2.43 — ABNORMAL HIGH (ref 0.00–1.49)
Prothrombin Time: 25.3 seconds — ABNORMAL HIGH (ref 11.6–15.2)

## 2012-07-31 NOTE — Progress Notes (Signed)
Agree with updated d/c recommendations as well as d/c PT at this time.  07/31/2012 Cephus Shelling, PT, DPT [phone]

## 2012-07-31 NOTE — Progress Notes (Signed)
Physical Therapy Treatment Patient Details Name: Tim Ponce MRN: [id number] DOB: 01-Sep-1931 Today's Date: 07/31/2012 Time: 4098-1191 PT Time Calculation (min): 17 min  PT Assessment / Plan / Recommendation Comments on Treatment Session  Patient now is returning home instead of discharging to [facility] as he has progressed very well. His son will be able to check on him throughout the day and assist as needed. Patient Mod I with all mobility. Will signoff on acute PT..    Follow Up Recommendations  No PT follow up     Does the patient have the potential to tolerate intense rehabilitation     Barriers to Discharge        Equipment Recommendations  None recommended by PT    Recommendations for Other Services    Frequency     Plan Discharge plan needs to be updated    Precautions / Restrictions Precautions Precautions: Back Precaution Comments: Patient able to recall and maintain all precautions Required Braces or Orthoses: Spinal Brace Spinal Brace: Lumbar corset   Pertinent Vitals/Pain     Mobility  Bed Mobility Rolling Right: 6: Modified independent (Device/Increase time) Right Sidelying to Sit: 6: Modified independent (Device/Increase time) Supine to Sit: 6: Modified independent (Device/Increase time) Sit to Supine: 6: Modified independent (Device/Increase time) Transfers Sit to Stand: 6: Modified independent (Device/Increase time) Stand to Sit: 6: Modified independent (Device/Increase time) Ambulation/Gait Ambulation/Gait Assistance: 6: Modified independent (Device/Increase time) Ambulation Distance (Feet): 800 Feet Assistive device: None Gait Pattern: Within Functional Limits Stairs: Yes Stairs Assistance: 6: Modified independent (Device/Increase time) Stair Management Technique: One rail Right;Alternating pattern;Forwards Number of Stairs: 12     Exercises     PT Diagnosis:    PT Problem List:   PT Treatment Interventions:     PT Goals Acute Rehab  PT Goals PT Goal: Rolling Supine to Right Side - Progress: Met PT Goal: Rolling Supine to Left Side - Progress: Met PT Goal: Supine/Side to Sit - Progress: Met PT Goal: Sit to Supine/Side - Progress: Met PT Goal: Sit to Stand - Progress: Met PT Goal: Stand to Sit - Progress: Met PT Goal: Ambulate - Progress: Met Pt will Go Up / Down Stairs: Flight;with modified independence;with rail(s) PT Goal: Up/Down Stairs - Progress: Goal set today (and goal met) Additional Goals PT Goal: Additional Goal #1 - Progress: Met  Visit Information  Last PT Received On: 07/31/12 Assistance Needed: +1    Subjective Data      Cognition  Overall Cognitive Status: Appears within functional limits for tasks assessed/performed Arousal/Alertness: Awake/alert Orientation Level: Appears intact for tasks assessed Behavior During Session: [facility] for tasks performed    Balance     End of Session PT - End of Session Equipment Utilized During Treatment: Gait belt;Back brace Activity Tolerance: Patient tolerated treatment well Patient left: in chair;with call bell/phone within reach Nurse Communication: Mobility status   GP     Fredrich Birks 07/31/2012, 8:57 AM 07/31/2012 Fredrich Birks PTA [phone] pager [phone] office

## 2012-07-31 NOTE — Progress Notes (Signed)
   Subjective: 4 Days Post-Op Procedure(s) (LRB): LUMBAR LAMINECTOMY/DECOMPRESSION MICRODISCECTOMY 1 LEVEL (Right)   Received a call while at [facility]. Nurse states that the patient reports pain as mild, pain well controlled. He is not taking any extra medications in the hospital. He has done well and met all goals with PT, which suggest home over SNF. He states that he is ready to be discharged home.  Objective:   VITALS:   Filed Vitals:   07/31/12 0514  BP: 126/56  Pulse: 58  Temp: 98.9 F (37.2 C)  Resp: 16   Incision: dressing C/D/I  LABS No new labs  Assessment/Plan: 4 Days Post-Op Procedure(s) (LRB): LUMBAR LAMINECTOMY/DECOMPRESSION MICRODISCECTOMY 1 LEVEL (Right)  Up with therapy. Discharge home today. Daily dressing changes or as needed. Follow up on Wednesday 08/02/2012 with PCP for monitoring of INR and Coumadin. Follow up in 10 days at [facility].  Follow up with Dr Shon Baton in 10 days.   Contact information:   [facility] [address] [place] [address] [phone]           Anastasio Auerbach. Keon Benscoter   PAC  07/31/2012, 6:28 PM

## 2012-07-31 NOTE — Progress Notes (Signed)
Occupational Therapy Treatment Patient Details Name: Tim Ponce MRN: [id number] DOB: 09/21/1930 Today's Date: 07/31/2012 Time: 9528-4132 OT Time Calculation (min): 9 min  OT Assessment / Plan / Recommendation Comments on Treatment Session Pt has met all OT goals and able to d/c home.    Follow Up Recommendations  No OT follow up    Barriers to Discharge       Equipment Recommendations  None recommended by OT    Recommendations for Other Services    Frequency Min 2X/week   Plan Discharge plan needs to be updated    Precautions / Restrictions Precautions Precautions: Back Precaution Comments: Patient able to recall and maintain all precautions Required Braces or Orthoses: Spinal Brace Spinal Brace: Lumbar corset   Pertinent Vitals/Pain None reported    ADL  Lower Body Dressing: Performed;Modified independent Where Assessed - Lower Body Dressing: Supine, head of bed flat Tub/Shower Transfer: Performed;Modified independent Tub/Shower Transfer Method: Science writer: Other (comment) (tub transfer simulated with trash can) Transfers/Ambulation Related to ADLs: pt ambulated from bed to restroom - mod I without rw ADL Comments: pt completed tub transfer MOd i and excellent return demo of back precautions. Pt requesting to d/c home now with increased independence. SW and CM notified of patient request to d/c home    OT Diagnosis:    OT Problem List:   OT Treatment Interventions:     OT Goals Acute Rehab OT Goals OT Goal Formulation: With patient Time For Goal Achievement: 08/11/12 Potential to Achieve Goals: Good ADL Goals Pt Will Perform Upper Body Dressing: with supervision;Sitting, chair;Sitting, bed;Unsupported ADL Goal: Upper Body Dressing - Progress: Met Pt Will Perform Lower Body Dressing: with supervision;Sit to stand from chair;Sit to stand from bed;with adaptive equipment ADL Goal: Lower Body Dressing - Progress: Met Pt Will  Transfer to Toilet: with supervision;Ambulation;Raised toilet seat with arms;Maintaining back safety precautions ADL Goal: Toilet Transfer - Progress: Met Miscellaneous OT Goals Miscellaneous OT Goal #1: Pt will recall 3/3 precautions as precursor to completing ADL's OT Goal: Miscellaneous Goal #1 - Progress: Met  Visit Information  Last OT Received On: 07/31/12 Assistance Needed: +1    Subjective Data      Prior Functioning       Cognition  Overall Cognitive Status: Appears within functional limits for tasks assessed/performed Arousal/Alertness: Awake/alert Orientation Level: Appears intact for tasks assessed Behavior During Session: [facility] for tasks performed    Mobility  Shoulder Instructions Bed Mobility Rolling Right: 6: Modified independent (Device/Increase time) Right Sidelying to Sit: 6: Modified independent (Device/Increase time) Supine to Sit: 6: Modified independent (Device/Increase time) Sit to Supine: 6: Modified independent (Device/Increase time) Scooting to [facility]: 6: Modified independent (Device/Increase time) Details for Bed Mobility Assistance: WFL  Transfers Sit to Stand: 6: Modified independent (Device/Increase time) Stand to Sit: 6: Modified independent (Device/Increase time) Details for Transfer Assistance: [facility]       Exercises      Balance     End of Session OT - End of Session Activity Tolerance: Patient tolerated treatment well Patient left: in bed;with call bell/phone within reach Nurse Communication: Mobility status  GO Functional Assessment Tool Used: clinical judgement Functional Limitation: Self care Self Care Current Status (G4010): 0 percent impaired, limited or restricted Self Care Goal Status (U7253): 0 percent impaired, limited or restricted Self Care Discharge Status (G6440): 0 percent impaired, limited or restricted   Lucile Shutters 07/31/2012, 9:39 AM Pager: [phone]

## 2012-08-07 NOTE — Discharge Summary (Signed)
Patient ID: Tim Ponce MRN: [id number] DOB/AGE: May 02, 1931 78 y.o.  Admit date: 07/27/2012 Discharge date: 08/07/2012  Admission Diagnoses:  Principal Problem:  *Lumbar disc herniation with radiculopathy   Discharge Diagnoses:  Principal Problem:  *Lumbar disc herniation with radiculopathy  status post Procedure(s): LUMBAR LAMINECTOMY/DECOMPRESSION MICRODISCECTOMY 1 LEVEL  Past Medical History  Diagnosis Date  . Atrial fibrillation   . Atrial flutter     s/p ablation Sep 28, 2011  . GERD (gastroesophageal reflux disease)   . Hypertension   . Hypothyroidism   . Hyperparathyroidism   . Pericarditis     age 8  . H/O hiatal hernia   . History of blood transfusion     "related to left knee replacement" (07/27/2012)  . Hemorrhoid   . Coronary artery disease     mild, 60% stenosis of prox LAD per CTA 2007; normal perfusion study 2010 ([facility])  . Hypercholesteremia   . OSA on CPAP     compliant with CPAP  . Anemia     "borderline" (07/27/2012)  . DJD (degenerative joint disease)     "knees and lower back" (07/27/2012)  . Bipolar disorder   . Depression   . Anxiety   . CKD (chronic kidney disease) stage 3, GFR 30-59 ml/min     "almost CKD 4" (07/27/2012)    Surgeries: Procedure(s): LUMBAR LAMINECTOMY/DECOMPRESSION MICRODISCECTOMY 1 LEVEL on 07/27/2012   Consultants:  none  Discharged Condition: Improved  Hospital Course: Tim Ponce is an 78 y.o. male who was admitted 07/27/2012 for operative treatment of Lumbar disc herniation with radiculopathy. Patient failed conservative treatments (please see the history and physical for the specifics) and had severe unremitting pain that affects sleep, daily activities and work/hobbies. After pre-op clearance, the patient was taken to the operating room on 07/27/2012 and underwent  Procedure(s): LUMBAR LAMINECTOMY/DECOMPRESSION MICRODISCECTOMY 1 LEVEL.    Patient was given perioperative antibiotics:  Anti-infectives    Start     Dose/Rate Route Frequency Ordered Stop   07/27/12 2100   ceFAZolin (ANCEF) IVPB 1 g/50 mL premix        1 g 100 mL/hr over 30 Minutes Intravenous Every 8 hours 07/27/12 1836 07/28/12 0622   07/26/12 1450   ceFAZolin (ANCEF) IVPB 2 g/50 mL premix        2 g 100 mL/hr over 30 Minutes Intravenous 30 min pre-op 07/26/12 1450 07/27/12 1253           Patient was given sequential compression devices and early ambulation to prevent DVT.   Patient benefited maximally from hospital stay and there were no complications. At the time of discharge, the patient was urinating/moving their bowels without difficulty, tolerating a regular diet, pain is controlled with oral pain medications and they have been cleared by PT/OT.   Recent vital signs: No data found.    Recent laboratory studies: No results found for this basename: WBC:2,HGB:2,HCT:2,PLT:2,NA:2,K:2,CL:2,CO2:2,BUN:2,CREATININE:2,GLUCOSE:2,PT:2,INR:2,CALCIUM,2: in the last 72 hours   Discharge Medications:     Medication List     As of 08/07/2012  6:32 PM    TAKE these medications         amiodarone 200 MG tablet   Commonly known as: PACERONE   Take 200 mg by mouth every morning.      amLODipine 5 MG tablet   Commonly known as: NORVASC   Take 5 mg by mouth at bedtime.      calcitRIOL 0.25 MCG capsule   Commonly known as: ROCALTROL   Take  0.25 mcg by mouth every evening.      carvedilol 12.5 MG tablet   Commonly known as: COREG   Take 25 mg by mouth 2 (two) times daily with a meal.      cholecalciferol 1000 UNITS tablet   Commonly known as: VITAMIN D   Take 1,000 Units by mouth 2 (two) times daily.      furosemide 40 MG tablet   Commonly known as: LASIX   Take 40 mg by mouth 3 (three) times a week. Mon, Wed, Fri      lamoTRIgine 200 MG tablet   Commonly known as: LAMICTAL   Take 200 mg by mouth at bedtime.      levothyroxine 112 MCG tablet   Commonly known as: SYNTHROID, LEVOTHROID   Take 112 mcg by mouth  daily before breakfast.      omeprazole 20 MG capsule   Commonly known as: PRILOSEC   Take 20 mg by mouth every morning.      pregabalin 75 MG capsule   Commonly known as: LYRICA   Take 75 mg by mouth 3 (three) times daily.      simvastatin 20 MG tablet   Commonly known as: ZOCOR   Take 20 mg by mouth at bedtime.      tadalafil 5 MG tablet   Commonly known as: CIALIS   Take 5 mg by mouth daily as needed. ERECTILE DYSFUNCTION      temazepam 15 MG capsule   Commonly known as: RESTORIL   Take 15-30 mg by mouth at bedtime as needed. For sleep      warfarin 5 MG tablet   Commonly known as: COUMADIN   Take 5 mg by mouth See admin instructions. Evern Bio, Wed, Fri, Sat        Diagnostic Studies: Dg Chest 2 View  07/26/2012  *RADIOLOGY REPORT*  Clinical Data: Hypertension.  CHEST - 2 VIEW  Comparison: July 24, 2009.  Findings: Cardiomediastinal silhouette appears normal. Hypoinflation of the lungs is noted consistent with chronic obstructive pulmonary disease.  Small calcified granuloma in left lower lobe is again noted and unchanged.  No acute pulmonary disease is noted.  Bony thorax is intact.  IMPRESSION: No acute cardiopulmonary abnormality seen.   Original Report Authenticated By: Lupita Raider.,  M.D.    Dg Lumbar Spine 2-3 Views  07/27/2012  *RADIOLOGY REPORT*  Clinical Data: Lumbar disc disease.  LUMBAR SPINE - 2-3 VIEW  Comparison: Radiographs dated 07/26/2012  Findings: Radiograph #1 demonstrates needles at the L4-5 and L5-S1 levels.  Radiograph #3 demonstrates an instrument at the L4-5 disc space.  IMPRESSION: Instrument at L4-5.   Original Report Authenticated By: Francene Boyers, M.D.    Dg Lumbar Spine 2-3 Views  07/26/2012  *RADIOLOGY REPORT*  Clinical Data: Preoperative evaluation for lumbar surgery  LUMBAR SPINE - 2-3 VIEW  Comparison: None available  Findings: Five lumbar non-rib bearing vertebral bodies noted. Diffuse degenerative changes.  Normal alignment without  compression fracture or focal kyphosis.  Preserved vertebral body heights. Atherosclerosis of the aorta.  IMPRESSION: Diffuse lumbar degenerative changes.  No acute finding.   Original Report Authenticated By: Judie Petit. Miles Costain, M.D.    Dg Spine Portable 1 View  07/27/2012  *RADIOLOGY REPORT*  Clinical Data: Intraoperative radiograph for L4-5 laminectomy  PORTABLE SPINE - 1 VIEW  Comparison: 07/27/2012  Findings: Surgical hardware projects over the inferior L4 facet of the L4-5 facet joint.  There is minimal anterolisthesis of L4-L5.  IMPRESSION: Localization hardware projects  over the L4-5 facet joint.   Original Report Authenticated By: Jearld Lesch, M.D.         Discharge Orders    Future Orders Please Complete By Expires   Diet - low sodium heart healthy      Call MD / Call 911      Comments:   If you experience chest pain or shortness of breath, CALL 911 and be transported to the hospital emergency room.  If you develope a fever above 101 F, pus (white drainage) or increased drainage or redness at the wound, or calf pain, call your surgeon's office.   Discharge instructions      Comments:   Daily dressing changes with 4x4 gauze and tape. Keep the area dry and clean until follow up. Follow up in 2 weeks at [facility]. Call with any questions or concerns.   Constipation Prevention      Comments:   Drink plenty of fluids.  Prune juice may be helpful.  You may use a stool softener, such as Colace (over the counter) 100 mg twice a day.  Use MiraLax (over the counter) for constipation as needed.   Increase activity slowly as tolerated         Follow-up Information    Follow up with Alvy Beal, MD. In 10 days.   Contact information:   [address] [place] [address] [phone]          Discharge Plan:  discharge to home   Disposition:  Patient improved during hospital stay Cleared by PT/OT for home discharge Although I did have reservations about  him returning to home after an extensive operation, the patient made excellent progress and was felt yo be safe to be discharged to home.   Signed: Venita Lick D for Dr. Venita Lick [facility] [phone] 08/07/2012, 6:32 PM

## 2012-08-19 ENCOUNTER — Emergency Department (HOSPITAL_COMMUNITY)
Admission: EM | Admit: 2012-08-19 | Discharge: 2012-08-19 | Disposition: A | Discharge status: Home / Self Care | Payer: Medicare Other | Attending: Emergency Medicine | Admitting: Emergency Medicine

## 2012-08-19 ENCOUNTER — Encounter (HOSPITAL_COMMUNITY): Payer: Self-pay | Admitting: *Deleted

## 2012-08-19 DIAGNOSIS — I2510 Atherosclerotic heart disease of native coronary artery without angina pectoris: Secondary | ICD-10-CM | POA: Insufficient documentation

## 2012-08-19 DIAGNOSIS — E213 Hyperparathyroidism, unspecified: Secondary | ICD-10-CM | POA: Insufficient documentation

## 2012-08-19 DIAGNOSIS — I129 Hypertensive chronic kidney disease with stage 1 through stage 4 chronic kidney disease, or unspecified chronic kidney disease: Secondary | ICD-10-CM | POA: Insufficient documentation

## 2012-08-19 DIAGNOSIS — R531 Weakness: Secondary | ICD-10-CM

## 2012-08-19 DIAGNOSIS — Z79899 Other long term (current) drug therapy: Secondary | ICD-10-CM | POA: Insufficient documentation

## 2012-08-19 DIAGNOSIS — R5381 Other malaise: Secondary | ICD-10-CM | POA: Insufficient documentation

## 2012-08-19 DIAGNOSIS — F3289 Other specified depressive episodes: Secondary | ICD-10-CM | POA: Insufficient documentation

## 2012-08-19 DIAGNOSIS — F329 Major depressive disorder, single episode, unspecified: Secondary | ICD-10-CM | POA: Insufficient documentation

## 2012-08-19 DIAGNOSIS — G4733 Obstructive sleep apnea (adult) (pediatric): Secondary | ICD-10-CM | POA: Insufficient documentation

## 2012-08-19 DIAGNOSIS — K219 Gastro-esophageal reflux disease without esophagitis: Secondary | ICD-10-CM | POA: Insufficient documentation

## 2012-08-19 DIAGNOSIS — N183 Chronic kidney disease, stage 3 unspecified: Secondary | ICD-10-CM | POA: Insufficient documentation

## 2012-08-19 DIAGNOSIS — Z9889 Other specified postprocedural states: Secondary | ICD-10-CM | POA: Insufficient documentation

## 2012-08-19 DIAGNOSIS — Z8719 Personal history of other diseases of the digestive system: Secondary | ICD-10-CM | POA: Insufficient documentation

## 2012-08-19 DIAGNOSIS — E039 Hypothyroidism, unspecified: Secondary | ICD-10-CM | POA: Insufficient documentation

## 2012-08-19 DIAGNOSIS — F319 Bipolar disorder, unspecified: Secondary | ICD-10-CM | POA: Insufficient documentation

## 2012-08-19 DIAGNOSIS — F411 Generalized anxiety disorder: Secondary | ICD-10-CM | POA: Insufficient documentation

## 2012-08-19 DIAGNOSIS — Z862 Personal history of diseases of the blood and blood-forming organs and certain disorders involving the immune mechanism: Secondary | ICD-10-CM | POA: Insufficient documentation

## 2012-08-19 DIAGNOSIS — E7800 Pure hypercholesterolemia, unspecified: Secondary | ICD-10-CM | POA: Insufficient documentation

## 2012-08-19 DIAGNOSIS — I4892 Unspecified atrial flutter: Secondary | ICD-10-CM | POA: Insufficient documentation

## 2012-08-19 DIAGNOSIS — Z87891 Personal history of nicotine dependence: Secondary | ICD-10-CM | POA: Insufficient documentation

## 2012-08-19 DIAGNOSIS — I4891 Unspecified atrial fibrillation: Secondary | ICD-10-CM | POA: Insufficient documentation

## 2012-08-19 LAB — COMPREHENSIVE METABOLIC PANEL
ALT: 11 U/L (ref 0–53)
AST: 22 U/L (ref 0–37)
Albumin: 3 g/dL — ABNORMAL LOW (ref 3.5–5.2)
CO2: 25 mEq/L (ref 19–32)
Chloride: 104 mEq/L (ref 96–112)
Creatinine, Ser: 3.39 mg/dL — ABNORMAL HIGH (ref 0.50–1.35)
Sodium: 140 mEq/L (ref 135–145)
Total Bilirubin: 0.4 mg/dL (ref 0.3–1.2)

## 2012-08-19 LAB — URINALYSIS, ROUTINE W REFLEX MICROSCOPIC
Bilirubin Urine: NEGATIVE
Glucose, UA: NEGATIVE mg/dL
Protein, ur: 30 mg/dL — AB
Specific Gravity, Urine: 1.011 (ref 1.005–1.030)

## 2012-08-19 LAB — URINE MICROSCOPIC-ADD ON

## 2012-08-19 LAB — CBC WITH DIFFERENTIAL/PLATELET
Basophils Absolute: 0 10*3/uL (ref 0.0–0.1)
Basophils Relative: 0 % (ref 0–1)
HCT: 34.5 % — ABNORMAL LOW (ref 39.0–52.0)
Lymphocytes Relative: 4 % — ABNORMAL LOW (ref 12–46)
MCHC: 33.3 g/dL (ref 30.0–36.0)
Monocytes Absolute: 1.3 10*3/uL — ABNORMAL HIGH (ref 0.1–1.0)
Neutro Abs: 11.9 10*3/uL — ABNORMAL HIGH (ref 1.7–7.7)
Neutrophils Relative %: 86 % — ABNORMAL HIGH (ref 43–77)
Platelets: 223 10*3/uL (ref 150–400)
RDW: 14.4 % (ref 11.5–15.5)
WBC: 13.9 10*3/uL — ABNORMAL HIGH (ref 4.0–10.5)

## 2012-08-19 MED ORDER — TRAMADOL HCL 50 MG PO TABS: 50.0000 mg | ORAL_TABLET | Freq: Four times a day (QID) | ORAL | Status: DC | PRN

## 2012-08-19 NOTE — ED Notes (Signed)
Pt states that he contact his surgeon who instructed pt "that this will take  While to releiv this lower back pain"

## 2012-08-19 NOTE — ED Notes (Addendum)
C/o worsening low back pain x 1 week. S/P back surgery 07-27-12. Denies injury.

## 2012-08-19 NOTE — ED Provider Notes (Addendum)
History     CSN: [id number]  Arrival date & time 08/19/12  1313   First MD Initiated Contact with Patient 08/19/12 1407      Chief Complaint  Patient presents with  . Back Pain    (Consider location/radiation/quality/duration/timing/severity/associated sxs/prior treatment) HPI Comments: Patient presents with pain in the low back and weakness for the past several days.  He recently underwent back surgery.  He denies any fevers or chills.  There are no bowel or bladder complaints.  He has an appointment with neurosurgery on Tuesday.  Patient is a 78 y.o. male presenting with back pain. The history is provided by the patient.  Back Pain  This is a new problem. Episode onset: one week ago. The problem occurs constantly. The problem has been gradually worsening.    Past Medical History  Diagnosis Date  . Atrial fibrillation   . Atrial flutter     s/p ablation Sep 28, 2011  . GERD (gastroesophageal reflux disease)   . Hypertension   . Hypothyroidism   . Hyperparathyroidism   . Pericarditis     age 15  . H/O hiatal hernia   . History of blood transfusion     "related to left knee replacement" (07/27/2012)  . Hemorrhoid   . Coronary artery disease     mild, 60% stenosis of prox LAD per CTA 2007; normal perfusion study 2010 ([facility])  . Hypercholesteremia   . OSA on CPAP     compliant with CPAP  . Anemia     "borderline" (07/27/2012)  . DJD (degenerative joint disease)     "knees and lower back" (07/27/2012)  . Bipolar disorder   . Depression   . Anxiety   . CKD (chronic kidney disease) stage 3, GFR 30-59 ml/min     "almost CKD 4" (07/27/2012)    Past Surgical History  Procedure Date  . Total knee arthroplasty 2008; ~ 2011    right; left  . Atrial flutter ablation 09/28/2011  . Knee arthroscopy 1990's    right  . Cardiac catheterization   . Lumbar laminectomy/decompression microdiscectomy 07/27/2012  . Lumbar laminectomy/decompression microdiscectomy 07/27/2012   Procedure: LUMBAR LAMINECTOMY/DECOMPRESSION MICRODISCECTOMY 1 LEVEL;  Surgeon: Venita Lick, MD;  Location: [facility] OR;  Service: Orthopedics;  Laterality: Right;  L4 - L5 Discectomy on the Right    No family history on file.  History  Substance Use Topics  . Smoking status: Former Smoker -- 2.0 packs/day for 20 years    Types: Cigarettes  . Smokeless tobacco: Never Used     Comment: 07/27/2012 "quit smoking cigarettes 40-45 yr ago"  . Alcohol Use: 4.8 oz/week    8 Glasses of wine per week     Comment: 1-2 glasses of red wine several times per week; "average 4 days/wk; couple glasses" (07/27/2012)      Review of Systems  Musculoskeletal: Positive for back pain.  All other systems reviewed and are negative.    Allergies  Review of patient's allergies indicates no known allergies.  Home Medications   Current Outpatient Rx  Name  Route  Sig  Dispense  Refill  . AMIODARONE HCL 200 MG PO TABS   Oral   Take 200 mg by mouth every morning.          Marland Kitchen AMLODIPINE BESYLATE 5 MG PO TABS   Oral   Take 5 mg by mouth at bedtime.          Marland Kitchen CALCITRIOL 0.25 MCG PO CAPS   Oral  Take 0.25 mcg by mouth every evening.          Marland Kitchen CARVEDILOL 12.5 MG PO TABS   Oral   Take 25 mg by mouth 2 (two) times daily with a meal.          . VITAMIN D 1000 UNITS PO TABS   Oral   Take 1,000 Units by mouth 2 (two) times daily.         . FUROSEMIDE 40 MG PO TABS   Oral   Take 40 mg by mouth 3 (three) times a week. Mon, Wed, Fri         . LAMOTRIGINE 200 MG PO TABS   Oral   Take 200 mg by mouth at bedtime.          Marland Kitchen LEVOTHYROXINE SODIUM 112 MCG PO TABS   Oral   Take 112 mcg by mouth daily before breakfast.          . OMEPRAZOLE 20 MG PO CPDR   Oral   Take 20 mg by mouth every morning.          Marland Kitchen PREGABALIN 75 MG PO CAPS   Oral   Take 75 mg by mouth 3 (three) times daily.         Marland Kitchen SIMVASTATIN 20 MG PO TABS   Oral   Take 20 mg by mouth at bedtime.          Marland Kitchen  TEMAZEPAM 15 MG PO CAPS   Oral   Take 15-30 mg by mouth at bedtime as needed. For sleep         . WARFARIN SODIUM 5 MG PO TABS   Oral   Take 5 mg by mouth See admin instructions. Take 5 mg on sun, tue, wed, fri, and sat           BP 153/74  Pulse 85  Temp 98.7 F (37.1 C) (Oral)  Resp 19  SpO2 93%  Physical Exam  Nursing note and vitals reviewed. Constitutional: He is oriented to person, place, and time. He appears well-developed and well-nourished. No distress.  HENT:  Head: Normocephalic and atraumatic.  Mouth/Throat: Oropharynx is clear and moist.  Neck: Normal range of motion. Neck supple.  Cardiovascular: Normal rate and regular rhythm.   No murmur heard. Pulmonary/Chest: Effort normal and breath sounds normal. No respiratory distress. He has no wheezes.  Abdominal: Soft. Bowel sounds are normal. He exhibits no distension.  Musculoskeletal: Normal range of motion.       The incision site is noted to be healing well.  There is no erythema or warmth.    Neurological: He is alert and oriented to person, place, and time. No cranial nerve deficit. Coordination normal.       The DTR's are 2+ and equal in the ble.  Strength is 5/5 in the ble.  He is ambulatory without difficulty.    Skin: Skin is warm and dry. He is not diaphoretic.    ED Course  Procedures (including critical care time)  Labs Reviewed  CBC WITH DIFFERENTIAL - Abnormal; Notable for the following:    WBC 13.9 (*)     RBC 3.72 (*)     Hemoglobin 11.5 (*)     HCT 34.5 (*)     Neutrophils Relative 86 (*)     Neutro Abs 11.9 (*)     Lymphocytes Relative 4 (*)     Lymphs Abs 0.6 (*)     Monocytes Absolute 1.3 (*)  All other components within normal limits  COMPREHENSIVE METABOLIC PANEL - Abnormal; Notable for the following:    Glucose, Bld 115 (*)     BUN 41 (*)     Creatinine, Ser 3.39 (*)     Albumin 3.0 (*)     GFR calc non Af Amer 16 (*)     GFR calc Af Amer 18 (*)     All other components  within normal limits  URINALYSIS, ROUTINE W REFLEX MICROSCOPIC - Abnormal; Notable for the following:    Hgb urine dipstick LARGE (*)     Protein, ur 30 (*)     All other components within normal limits  URINE MICROSCOPIC-ADD ON   No results found.   No diagnosis found.   Date: 08/19/2012  Rate: 79  Rhythm: normal sinus rhythm  QRS Axis: left  Intervals: normal  ST/T Wave abnormalities: nonspecific T wave changes  Conduction Disutrbances:right bundle branch block  Narrative Interpretation:   Old EKG Reviewed: none available    MDM  The patient appears well.  The incision appears to be healing well and does not appear infected.  The reflexes and strength are intact and there are no bowel or bladder deficits.  I have personally ambulated him in the ED and he is very steady on his feet and strength is intact.  I believe he is very appropriate for discharge.  He has a slight leukocytosis, the etiology of this I am unsure.  The urine is clear and he has no other complaints that are suggestive of infection.          Geoffery Lyons, MD 08/19/12 1529  Geoffery Lyons, MD 08/19/12 1534

## 2012-11-21 IMAGING — CR DG LUMBAR SPINE 2-3V
2 series · 2 of 2 positions shown · non-contrast
Comparison: None available

CLINICAL DATA: Preoperative evaluation for lumbar surgery

LUMBAR SPINE - 2-3 VIEW

[view not recorded (1 of 2)]
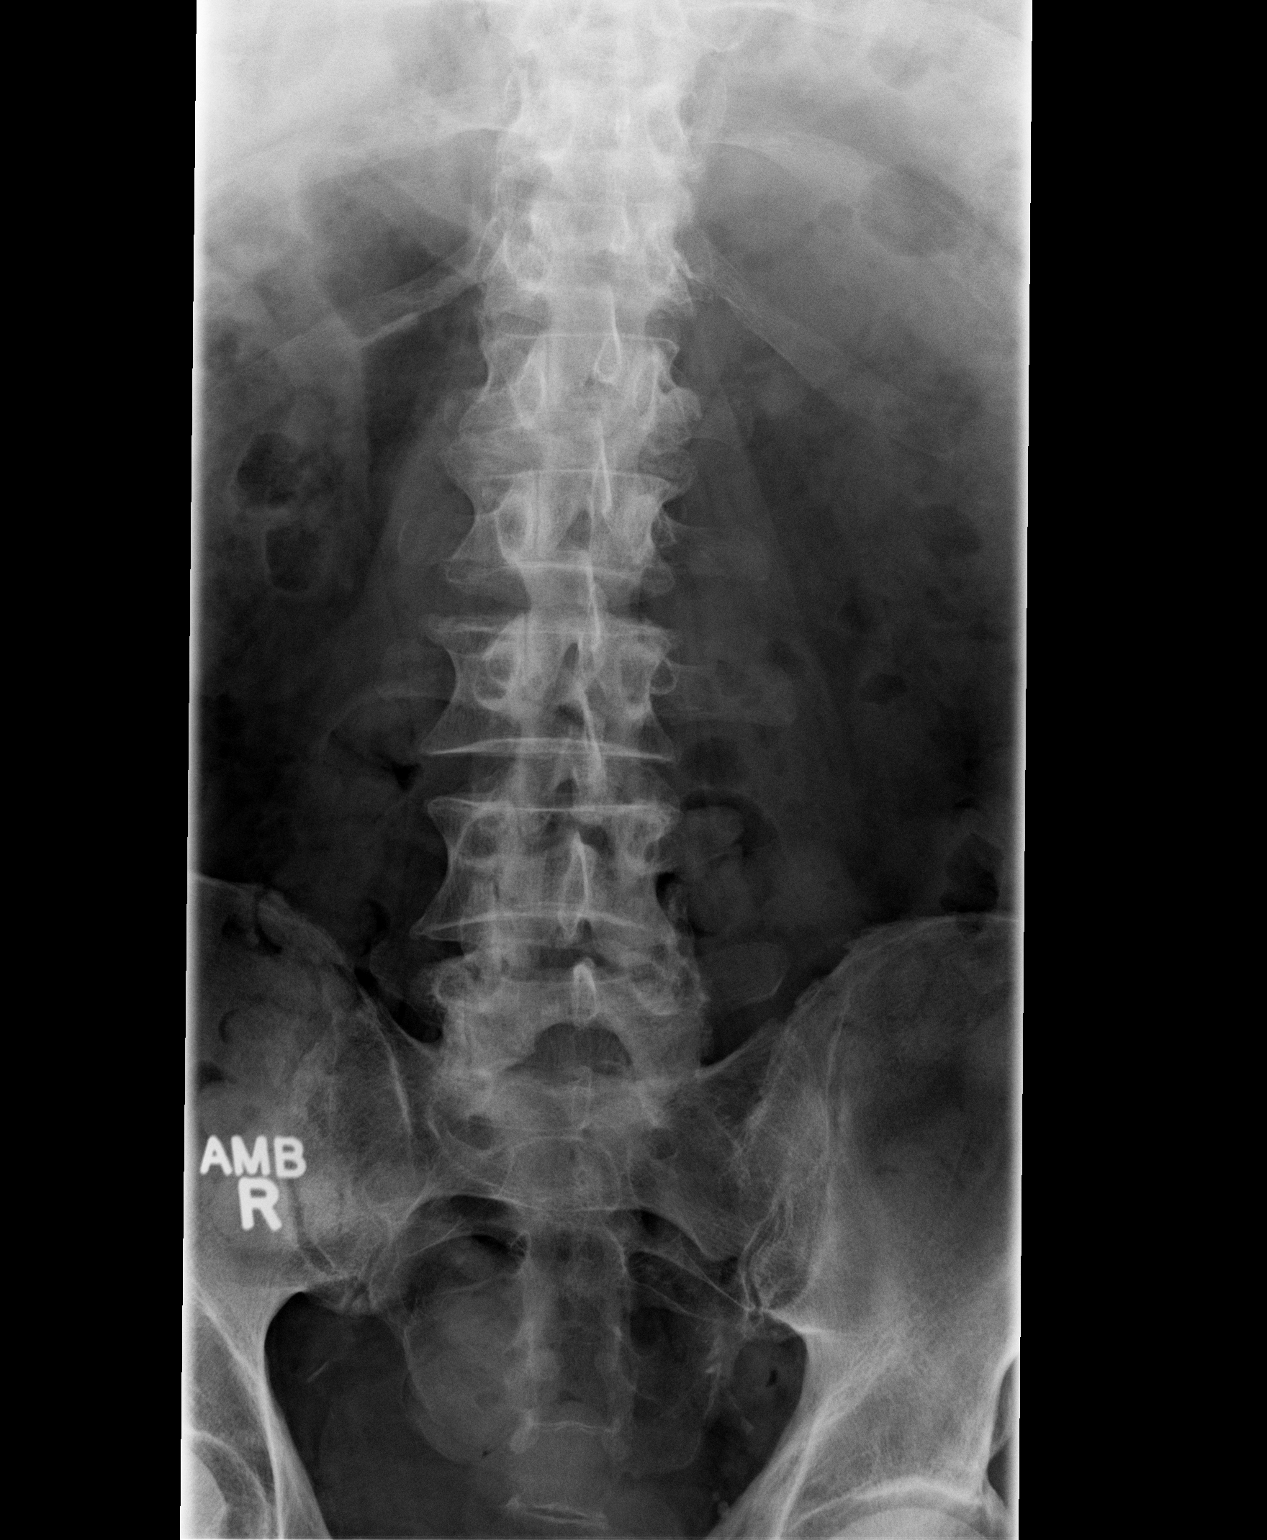

[view not recorded (2 of 2)]
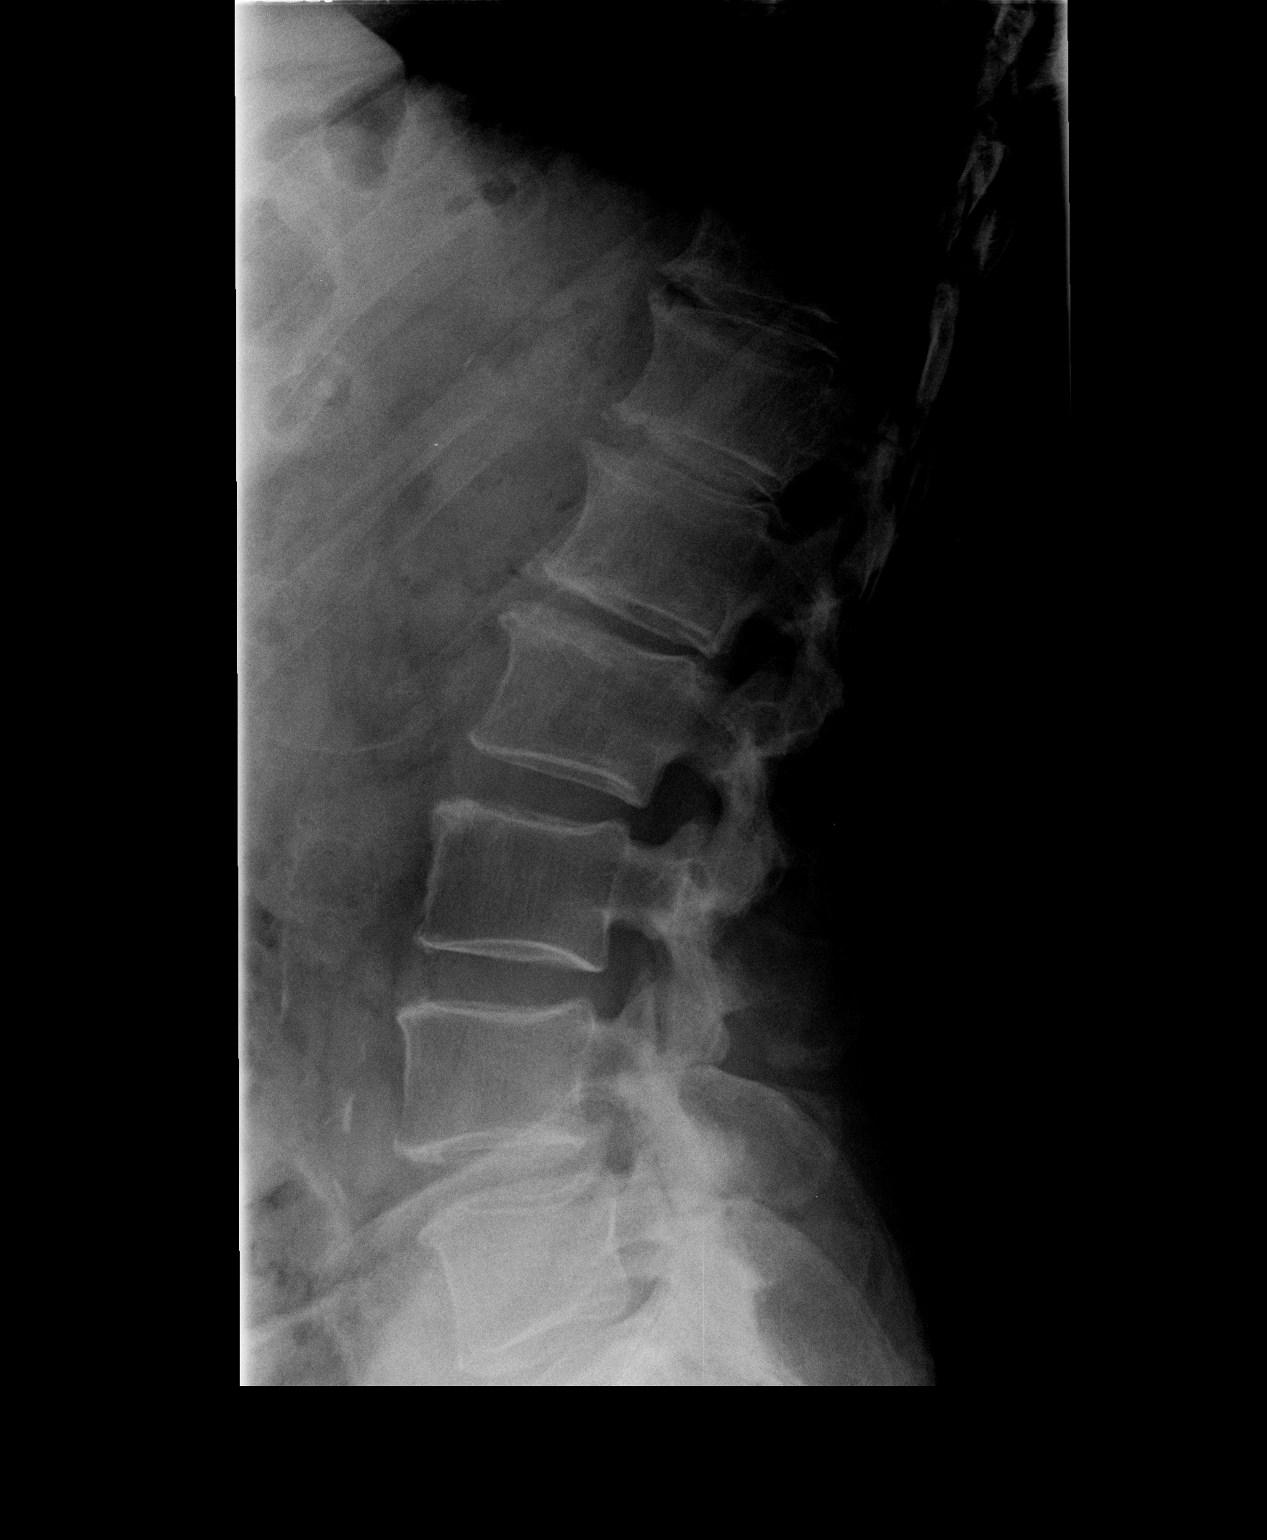

[2 of 2 positions shown; findings below may reference images not displayed]

FINDINGS: Five lumbar non-rib bearing vertebral bodies noted.
Diffuse degenerative changes.  Normal alignment without compression
fracture or focal kyphosis.  Preserved vertebral body heights.
Atherosclerosis of the aorta.
IMPRESSION: Diffuse lumbar degenerative changes.  No acute finding.

## 2013-07-22 ENCOUNTER — Other Ambulatory Visit: Payer: Self-pay | Admitting: *Deleted

## 2013-07-22 DIAGNOSIS — Z79899 Other long term (current) drug therapy: Secondary | ICD-10-CM

## 2013-08-02 ENCOUNTER — Encounter: Payer: Self-pay | Admitting: Interventional Cardiology

## 2013-08-22 ENCOUNTER — Other Ambulatory Visit: Payer: Medicare Other

## 2013-10-01 ENCOUNTER — Encounter: Payer: Self-pay | Admitting: Interventional Cardiology

## 2013-10-24 ENCOUNTER — Telehealth: Payer: Self-pay | Admitting: Interventional Cardiology

## 2013-10-24 NOTE — Telephone Encounter (Signed)
Elmo Putt, do you know if we have received referral?

## 2013-10-24 NOTE — Telephone Encounter (Signed)
New message        Pt wants to know if you have received his referral for [facility] to come for dr Irish Lack

## 2013-10-26 NOTE — Telephone Encounter (Signed)
Chamaine, do you know what needs to be done about this. I am not sure how this works.

## 2013-10-26 NOTE — Telephone Encounter (Signed)
Spoke w/pt.  He now has [facility] and requires a referral to see Dr. Irish Lack.  I referred him to check w/his PCP as they are the initiators of their pts' referral.  He will call them to ensure they have authorized him to come here.

## 2013-11-13 ENCOUNTER — Other Ambulatory Visit (HOSPITAL_COMMUNITY): Payer: Self-pay

## 2013-11-14 ENCOUNTER — Encounter (HOSPITAL_COMMUNITY)
Admission: RE | Admit: 2013-11-14 | Discharge: 2013-11-14 | Disposition: A | Discharge status: Home / Self Care | Payer: Medicare HMO | Source: Ambulatory Visit | Attending: Nephrology | Admitting: Nephrology

## 2013-11-14 DIAGNOSIS — N184 Chronic kidney disease, stage 4 (severe): Secondary | ICD-10-CM | POA: Insufficient documentation

## 2013-11-14 DIAGNOSIS — D649 Anemia, unspecified: Secondary | ICD-10-CM | POA: Diagnosis not present

## 2013-11-14 MED ORDER — SODIUM CHLORIDE 0.9 % IV SOLN
1020.0000 mg | Freq: Once | INTRAVENOUS | Status: AC
  Administered 2013-11-14: 1020 mg via INTRAVENOUS
  Filled 2013-11-14: qty 34

## 2013-11-20 ENCOUNTER — Ambulatory Visit: Payer: Medicare Other | Admitting: Interventional Cardiology

## 2013-11-21 ENCOUNTER — Ambulatory Visit: Payer: Medicare Other | Admitting: Interventional Cardiology

## 2014-01-03 ENCOUNTER — Ambulatory Visit (INDEPENDENT_AMBULATORY_CARE_PROVIDER_SITE_OTHER): Payer: Commercial Managed Care - HMO | Admitting: Interventional Cardiology

## 2014-01-03 ENCOUNTER — Encounter: Payer: Self-pay | Admitting: Interventional Cardiology

## 2014-01-03 VITALS — BP 140/72 | HR 47 | Ht 71.0 in | Wt 196.0 lb

## 2014-01-03 DIAGNOSIS — I10 Essential (primary) hypertension: Secondary | ICD-10-CM

## 2014-01-03 DIAGNOSIS — I2510 Atherosclerotic heart disease of native coronary artery without angina pectoris: Secondary | ICD-10-CM

## 2014-01-03 DIAGNOSIS — I4891 Unspecified atrial fibrillation: Secondary | ICD-10-CM

## 2014-01-03 DIAGNOSIS — E669 Obesity, unspecified: Secondary | ICD-10-CM

## 2014-01-03 MED ORDER — CARVEDILOL 6.25 MG PO TABS: ORAL_TABLET | ORAL | Status: AC

## 2014-01-03 MED ORDER — AMLODIPINE BESYLATE 10 MG PO TABS: ORAL_TABLET | ORAL | Status: AC

## 2014-01-03 NOTE — Progress Notes (Signed)
Patient ID: Tim Ponce, male   DOB: 27-Feb-1931, 80 y.o.   MRN: [id number]    [address], [place], [place]  [address] Phone: [phone] Fax:  [phone]  Date:  01/08/2014   ID:  Tim Ponce, DOB 23-Apr-1931, MRN [id number]  PCP:  Henrine Screws, MD      History of Present Illness: Tim Ponce is a 80 y.o. male who has had an atrial flutter ablation. BP has been fairly well controlled. Home readings controlled. Most readings in the 100 to 353 range systolic. He takes occasional diuretics for edema. He is taking lasix for 6 days stright per his nephrologist. No bleeding problems on the coumadin. Exercises 4 -5x/week. Atrial Fibrillation F/U:  c/o Dizziness while getting up from sitting position; unsteady on his feet. [facility] in June. Head MRI was ok. .  Denies : Chest pain.  Leg edema.  Orthopnea.  Palpitations.  Syncope.   He is looking at being evaluated for kidney transplant. Stands for 16-20 hrs a week working at [employer]    Wt Readings from Last 3 Encounters:  01/03/14 196 lb (88.905 kg)  07/26/12 208 lb 6.4 oz (94.53 kg)  10/11/11 210 lb (95.255 kg)     Past Medical History  Diagnosis Date  . Atrial fibrillation   . Atrial flutter     s/p ablation Sep 28, 2011  . GERD (gastroesophageal reflux disease)   . Hypertension   . Hypothyroidism   . Hyperparathyroidism   . Pericarditis     age 38  . H/O hiatal hernia   . History of blood transfusion     "related to left knee replacement" (07/27/2012)  . Hemorrhoid   . Coronary artery disease     mild, 60% stenosis of prox LAD per CTA 2007; normal perfusion study 2010 ([facility])  . Hypercholesteremia   . OSA on CPAP     compliant with CPAP  . Anemia     "borderline" (07/27/2012)  . DJD (degenerative joint disease)     "knees and lower back" (07/27/2012)  . Bipolar disorder   . Depression   . Anxiety   . CKD (chronic kidney disease) stage 3, GFR 30-59 ml/min     "almost CKD 4" (07/27/2012)     Current Outpatient Prescriptions  Medication Sig Dispense Refill  . amiodarone (PACERONE) 200 MG tablet Take 200 mg by mouth every morning.       Marland Kitchen amLODipine (NORVASC) 10 MG tablet 1 tablet daily  90 tablet  3  . amoxicillin (AMOXIL) 200 MG/5ML suspension Take by mouth 2 (two) times daily. For dental procedures      . calcitRIOL (ROCALTROL) 0.25 MCG capsule Take 0.25 mcg by mouth every evening.       . carvedilol (COREG) 6.25 MG tablet 1 tablet twice a day  180 tablet  3  . cholecalciferol (VITAMIN D) 1000 UNITS tablet Take 1,000 Units by mouth 2 (two) times daily. Vitamin d3      . furosemide (LASIX) 40 MG tablet Take 40 mg by mouth 3 (three) times a week. Mon, Wed, Fri      . iron dextran complex in sodium chloride 0.9 % 500 mL Inject into the vein once.      . lamoTRIgine (LAMICTAL) 25 MG tablet Take 25 mg by mouth 2 (two) times daily.      Marland Kitchen levothyroxine (SYNTHROID, LEVOTHROID) 112 MCG tablet Take 112 mcg by mouth daily before breakfast.       .  Magnesium Oxide 500 MG CAPS Take by mouth.      Marland Kitchen omeprazole (PRILOSEC) 20 MG capsule Take 20 mg by mouth every morning.       . simvastatin (ZOCOR) 20 MG tablet Take 20 mg by mouth at bedtime.       . tadalafil (CIALIS) 5 MG tablet Take 5 mg by mouth as needed for erectile dysfunction.      . temazepam (RESTORIL) 15 MG capsule Take 15-30 mg by mouth at bedtime as needed. For sleep      . vitamin B-12 (CYANOCOBALAMIN) 100 MCG tablet       . warfarin (COUMADIN) 5 MG tablet Take 5 mg by mouth See admin instructions. Take 5 mg on sun, tue, wed, fri, and sat       No current facility-administered medications for this visit.    Allergies:   No Known Allergies  Social History:  The patient  reports that he has quit smoking. His smoking use included Cigarettes. He has a 40 pack-year smoking history. He has never used smokeless tobacco. He reports that he drinks about 4.8 ounces of alcohol per week. He reports that he does not use illicit drugs.    Family History:  The patient's family history includes COPD in his father; Uterine cancer in his mother.   ROS:  Please see the history of present illness.  No nausea, vomiting.  No fevers, chills.  No focal weakness.  No dysuria.    All other systems reviewed and negative.   PHYSICAL EXAM: VS:  BP 140/72  Pulse 47  Ht 5\' 11"  (1.803 m)  Wt 196 lb (88.905 kg)  BMI 27.35 kg/m2 Well nourished, well developed, in no acute distress HEENT: normal Neck: no JVD, no carotid bruits Cardiac:  normal S1, S2; bradycardic Lungs:  clear to auscultation bilaterally, no wheezing, rhonchi or rales Abd: soft, nontender, no hepatomegaly Ext: no edema, 2+ DP pulses bilaterally, discolored ankles bilaterally Skin: warm and dry Neuro:   no focal abnormalities noted  EKG:  Sinus bradycardia, RBBB    ASSESSMENT AND PLAN:  Atrial fibrillation  Decrease Carvedilol Tablet, 6.25 MG, 1 tablet with food, Orally, Twice a day, 90 days, 180, Refills 3 Continue Amiodarone HCl Tablet, 200 MG, 1 tablet, Orally, Once a day  IMAGING: EKG   Harward,Amy 02/20/2013 10:02:38 AM > Tim Ponce,Tim Ponce 02/20/2013 10:14:13 AM > sinus bradycardia, RBBB   Notes: He is in sinus bradycardia. He did have a flutter ablation. Low dose amiodarone for maintenance of NSR. Continue Coumadin for stroke prevention. He reports low stamina and not being able to get the HR up. WIll decrease beta blocker as well.  2. Hypertension, essential  Increase Amlodipine Besylate Tablet, 10 MG, 1 tablet, Orally, Once a day Notes: Blood pressure controlled.  Continue to check blood pressure at home. readings at home have been controlled. Would like to see systolic less than 240 given his renal insufficiency.  Readings at home are often over 130.   3. Obesity  Notes: Spoke about the importance of weight loss. He is back exercising at this point. If he has any tachycardia palpitations, he'll let us know. Weight has been stable. Trying to get to 190 lbs. Waist  size has decreased.  4. Coronary atherosclerosis of native coronary artery  Notes: Records from [place] showed a CT angio showing a 60% LAD lesion. Stress test was negatiive in 2010. No angina.  WOuld try to postpone cath due to renal insufficiency.  Venous insufficiency: LE discolored.  Elevate legs. Consider compression stockings.   Signed, Mina Marble, MD, [facility] 01/08/2014 7:05 PM

## 2014-01-03 NOTE — Patient Instructions (Signed)
Your physician has recommended you make the following change in your medication:   1. Increase Amlodipine to 10 mg daily.  2. Decrease Carvedilol to 6.25 mg 1 tablet twice a day.   Your physician wants you to follow-up in: 1 year with Dr. Irish Lack. You will receive a reminder letter in the mail two months in advance. If you don't receive a letter, please call our office to schedule the follow-up appointment.

## 2014-01-08 ENCOUNTER — Encounter: Payer: Self-pay | Admitting: Interventional Cardiology

## 2014-01-08 DIAGNOSIS — I2510 Atherosclerotic heart disease of native coronary artery without angina pectoris: Secondary | ICD-10-CM | POA: Insufficient documentation

## 2014-01-08 DIAGNOSIS — E669 Obesity, unspecified: Secondary | ICD-10-CM | POA: Insufficient documentation

## 2014-04-03 ENCOUNTER — Encounter: Payer: Self-pay | Admitting: *Deleted

## 2014-08-29 ENCOUNTER — Encounter (HOSPITAL_COMMUNITY): Payer: Self-pay | Admitting: Internal Medicine

## 2014-10-01 DIAGNOSIS — J029 Acute pharyngitis, unspecified: Secondary | ICD-10-CM | POA: Diagnosis not present

## 2014-10-04 ENCOUNTER — Other Ambulatory Visit: Payer: Self-pay | Admitting: Internal Medicine

## 2014-10-04 ENCOUNTER — Ambulatory Visit
Admission: RE | Admit: 2014-10-04 | Discharge: 2014-10-04 | Disposition: A | Discharge status: Home / Self Care | Payer: Commercial Managed Care - HMO | Source: Ambulatory Visit | Attending: Internal Medicine | Admitting: Internal Medicine

## 2014-10-04 DIAGNOSIS — S6391XA Sprain of unspecified part of right wrist and hand, initial encounter: Secondary | ICD-10-CM | POA: Diagnosis not present

## 2014-10-04 DIAGNOSIS — R58 Hemorrhage, not elsewhere classified: Secondary | ICD-10-CM | POA: Diagnosis not present

## 2014-10-04 DIAGNOSIS — R0789 Other chest pain: Secondary | ICD-10-CM

## 2014-10-04 DIAGNOSIS — Z9189 Other specified personal risk factors, not elsewhere classified: Secondary | ICD-10-CM | POA: Diagnosis not present

## 2014-10-04 DIAGNOSIS — S0081XA Abrasion of other part of head, initial encounter: Secondary | ICD-10-CM | POA: Diagnosis not present

## 2014-10-04 DIAGNOSIS — S299XXA Unspecified injury of thorax, initial encounter: Secondary | ICD-10-CM | POA: Diagnosis not present

## 2014-10-07 DIAGNOSIS — G4733 Obstructive sleep apnea (adult) (pediatric): Secondary | ICD-10-CM | POA: Diagnosis not present

## 2014-10-08 DIAGNOSIS — Z792 Long term (current) use of antibiotics: Secondary | ICD-10-CM | POA: Diagnosis not present

## 2014-10-08 DIAGNOSIS — R0789 Other chest pain: Secondary | ICD-10-CM | POA: Diagnosis not present

## 2014-10-08 DIAGNOSIS — R05 Cough: Secondary | ICD-10-CM | POA: Diagnosis not present

## 2014-10-17 DIAGNOSIS — N401 Enlarged prostate with lower urinary tract symptoms: Secondary | ICD-10-CM | POA: Diagnosis not present

## 2014-10-17 DIAGNOSIS — R3915 Urgency of urination: Secondary | ICD-10-CM | POA: Diagnosis not present

## 2014-10-17 DIAGNOSIS — R351 Nocturia: Secondary | ICD-10-CM | POA: Diagnosis not present

## 2014-10-17 DIAGNOSIS — N5201 Erectile dysfunction due to arterial insufficiency: Secondary | ICD-10-CM | POA: Diagnosis not present

## 2014-10-22 DIAGNOSIS — Z7901 Long term (current) use of anticoagulants: Secondary | ICD-10-CM | POA: Diagnosis not present

## 2014-11-15 DIAGNOSIS — L720 Epidermal cyst: Secondary | ICD-10-CM | POA: Diagnosis not present

## 2014-11-15 DIAGNOSIS — D692 Other nonthrombocytopenic purpura: Secondary | ICD-10-CM | POA: Diagnosis not present

## 2014-11-15 DIAGNOSIS — D1801 Hemangioma of skin and subcutaneous tissue: Secondary | ICD-10-CM | POA: Diagnosis not present

## 2014-11-15 DIAGNOSIS — L821 Other seborrheic keratosis: Secondary | ICD-10-CM | POA: Diagnosis not present

## 2014-11-15 DIAGNOSIS — L718 Other rosacea: Secondary | ICD-10-CM | POA: Diagnosis not present

## 2014-11-20 DIAGNOSIS — N400 Enlarged prostate without lower urinary tract symptoms: Secondary | ICD-10-CM | POA: Diagnosis not present

## 2014-11-20 DIAGNOSIS — F39 Unspecified mood [affective] disorder: Secondary | ICD-10-CM | POA: Diagnosis not present

## 2014-11-20 DIAGNOSIS — M19031 Primary osteoarthritis, right wrist: Secondary | ICD-10-CM | POA: Diagnosis not present

## 2014-11-20 DIAGNOSIS — Z7901 Long term (current) use of anticoagulants: Secondary | ICD-10-CM | POA: Diagnosis not present

## 2014-11-20 DIAGNOSIS — N184 Chronic kidney disease, stage 4 (severe): Secondary | ICD-10-CM | POA: Diagnosis not present

## 2014-11-20 DIAGNOSIS — E782 Mixed hyperlipidemia: Secondary | ICD-10-CM | POA: Diagnosis not present

## 2014-11-20 DIAGNOSIS — N5201 Erectile dysfunction due to arterial insufficiency: Secondary | ICD-10-CM | POA: Diagnosis not present

## 2014-11-20 DIAGNOSIS — K2270 Barrett's esophagus without dysplasia: Secondary | ICD-10-CM | POA: Diagnosis not present

## 2014-12-03 DIAGNOSIS — N184 Chronic kidney disease, stage 4 (severe): Secondary | ICD-10-CM | POA: Diagnosis not present

## 2014-12-03 DIAGNOSIS — D649 Anemia, unspecified: Secondary | ICD-10-CM | POA: Diagnosis not present

## 2014-12-03 DIAGNOSIS — N2581 Secondary hyperparathyroidism of renal origin: Secondary | ICD-10-CM | POA: Diagnosis not present

## 2014-12-09 DIAGNOSIS — F3131 Bipolar disorder, current episode depressed, mild: Secondary | ICD-10-CM | POA: Diagnosis not present

## 2014-12-11 DIAGNOSIS — D649 Anemia, unspecified: Secondary | ICD-10-CM | POA: Diagnosis not present

## 2014-12-11 DIAGNOSIS — N2581 Secondary hyperparathyroidism of renal origin: Secondary | ICD-10-CM | POA: Diagnosis not present

## 2014-12-11 DIAGNOSIS — I129 Hypertensive chronic kidney disease with stage 1 through stage 4 chronic kidney disease, or unspecified chronic kidney disease: Secondary | ICD-10-CM | POA: Diagnosis not present

## 2014-12-11 DIAGNOSIS — N184 Chronic kidney disease, stage 4 (severe): Secondary | ICD-10-CM | POA: Diagnosis not present

## 2015-01-07 DIAGNOSIS — Z7901 Long term (current) use of anticoagulants: Secondary | ICD-10-CM | POA: Diagnosis not present

## 2015-02-04 DIAGNOSIS — Z7901 Long term (current) use of anticoagulants: Secondary | ICD-10-CM | POA: Diagnosis not present

## 2015-03-03 DIAGNOSIS — Z7901 Long term (current) use of anticoagulants: Secondary | ICD-10-CM | POA: Diagnosis not present

## 2015-03-12 DIAGNOSIS — E785 Hyperlipidemia, unspecified: Secondary | ICD-10-CM | POA: Diagnosis not present

## 2015-03-12 DIAGNOSIS — N2581 Secondary hyperparathyroidism of renal origin: Secondary | ICD-10-CM | POA: Diagnosis not present

## 2015-03-12 DIAGNOSIS — N184 Chronic kidney disease, stage 4 (severe): Secondary | ICD-10-CM | POA: Diagnosis not present

## 2015-03-12 DIAGNOSIS — D649 Anemia, unspecified: Secondary | ICD-10-CM | POA: Diagnosis not present

## 2015-03-18 DIAGNOSIS — I10 Essential (primary) hypertension: Secondary | ICD-10-CM | POA: Diagnosis not present

## 2015-03-18 DIAGNOSIS — N2581 Secondary hyperparathyroidism of renal origin: Secondary | ICD-10-CM | POA: Diagnosis not present

## 2015-03-18 DIAGNOSIS — N184 Chronic kidney disease, stage 4 (severe): Secondary | ICD-10-CM | POA: Diagnosis not present

## 2015-03-18 DIAGNOSIS — D649 Anemia, unspecified: Secondary | ICD-10-CM | POA: Diagnosis not present

## 2015-03-27 DIAGNOSIS — Z7901 Long term (current) use of anticoagulants: Secondary | ICD-10-CM | POA: Diagnosis not present

## 2015-04-01 DIAGNOSIS — N184 Chronic kidney disease, stage 4 (severe): Secondary | ICD-10-CM | POA: Diagnosis not present

## 2015-04-07 DIAGNOSIS — F3131 Bipolar disorder, current episode depressed, mild: Secondary | ICD-10-CM | POA: Diagnosis not present

## 2015-04-18 ENCOUNTER — Other Ambulatory Visit: Payer: Self-pay | Admitting: *Deleted

## 2015-04-18 DIAGNOSIS — Z01810 Encounter for preprocedural cardiovascular examination: Secondary | ICD-10-CM

## 2015-04-18 DIAGNOSIS — N185 Chronic kidney disease, stage 5: Secondary | ICD-10-CM

## 2015-04-23 DIAGNOSIS — Z7901 Long term (current) use of anticoagulants: Secondary | ICD-10-CM | POA: Diagnosis not present

## 2015-04-24 ENCOUNTER — Encounter: Payer: Self-pay | Admitting: Vascular Surgery

## 2015-04-25 ENCOUNTER — Ambulatory Visit: Payer: Commercial Managed Care - HMO | Admitting: Vascular Surgery

## 2015-04-25 ENCOUNTER — Other Ambulatory Visit (HOSPITAL_COMMUNITY): Payer: Commercial Managed Care - HMO

## 2015-04-25 ENCOUNTER — Inpatient Hospital Stay (HOSPITAL_COMMUNITY): Admission: RE | Admit: 2015-04-25 | Payer: Commercial Managed Care - HMO | Source: Ambulatory Visit

## 2015-04-28 ENCOUNTER — Encounter: Payer: Self-pay | Admitting: Vascular Surgery

## 2015-04-29 ENCOUNTER — Ambulatory Visit (INDEPENDENT_AMBULATORY_CARE_PROVIDER_SITE_OTHER): Payer: Commercial Managed Care - HMO | Admitting: Vascular Surgery

## 2015-04-29 ENCOUNTER — Ambulatory Visit (HOSPITAL_COMMUNITY)
Admission: RE | Admit: 2015-04-29 | Discharge: 2015-04-29 | Disposition: A | Discharge status: Home / Self Care | Payer: Commercial Managed Care - HMO | Source: Ambulatory Visit | Attending: Vascular Surgery | Admitting: Vascular Surgery

## 2015-04-29 ENCOUNTER — Other Ambulatory Visit: Payer: Self-pay

## 2015-04-29 ENCOUNTER — Encounter: Payer: Self-pay | Admitting: Vascular Surgery

## 2015-04-29 ENCOUNTER — Ambulatory Visit (INDEPENDENT_AMBULATORY_CARE_PROVIDER_SITE_OTHER)
Admission: RE | Admit: 2015-04-29 | Discharge: 2015-04-29 | Disposition: A | Discharge status: Home / Self Care | Payer: Commercial Managed Care - HMO | Source: Ambulatory Visit | Attending: Surgery | Admitting: Surgery

## 2015-04-29 VITALS — BP 102/66 | HR 65 | Temp 97.2°F | Resp 18 | Ht 71.0 in | Wt 218.0 lb

## 2015-04-29 DIAGNOSIS — Z01810 Encounter for preprocedural cardiovascular examination: Secondary | ICD-10-CM

## 2015-04-29 DIAGNOSIS — N184 Chronic kidney disease, stage 4 (severe): Secondary | ICD-10-CM | POA: Diagnosis not present

## 2015-04-29 DIAGNOSIS — N185 Chronic kidney disease, stage 5: Secondary | ICD-10-CM | POA: Insufficient documentation

## 2015-04-29 NOTE — Progress Notes (Signed)
Subjective:     Patient ID: Tim Ponce, male   DOB: 1931/07/08, 81 y.o.   MRN: [id number]  HPI This 81 year old male was referred by Dr. Graylon Gunning for evaluation of vascular access. Patient has not been on hemodialysis in the past. He will be using peritoneal dialysis. He is evaluated for access  As a backup to peritoneal dialysis. Patient is right-handed. He denies pain or numbness in either hand. He does take chronic Coumadin presumably for a remote history of A. Fib and flutter. He has had an ablation procedure and that was corrected but he remains on Coumadin.   Past Medical History  Diagnosis Date  . Atrial fibrillation   . Atrial flutter     s/p ablation Sep 28, 2011  . GERD (gastroesophageal reflux disease)   . Hypertension   . Hypothyroidism   . Hyperparathyroidism   . Pericarditis     age 89  . H/O hiatal hernia   . History of blood transfusion     "related to left knee replacement" (07/27/2012)  . Hemorrhoid   . Coronary artery disease     mild, 60% stenosis of prox LAD per CTA 2007; normal perfusion study 2010 ([facility])  . Hypercholesteremia   . OSA on CPAP     compliant with CPAP  . Anemia     "borderline" (07/27/2012)  . DJD (degenerative joint disease)     "knees and lower back" (07/27/2012)  . Bipolar disorder   . Depression   . Anxiety   . CKD (chronic kidney disease) stage 3, GFR 30-59 ml/min     "almost CKD 4" (07/27/2012)    History  Substance Use Topics  . Smoking status: Former Smoker -- 2.00 packs/day for 20 years    Types: Cigarettes  . Smokeless tobacco: Never Used     Comment: 07/27/2012 "quit smoking cigarettes 40-45 yr ago"  . Alcohol Use: 4.8 oz/week    8 Glasses of wine per week     Comment: 1-2 glasses of red wine several times per week; "average 4 days/wk; couple glasses" (07/27/2012)    Family History  Problem Relation Age of Onset  . Uterine cancer Mother   . COPD Father     No Known Allergies   Current outpatient prescriptions:  .   amiodarone (PACERONE) 200 MG tablet, Take 200 mg by mouth every morning. , Disp: , Rfl:  .  amoxicillin (AMOXIL) 200 MG/5ML suspension, Take by mouth 2 (two) times daily. For dental procedures, Disp: , Rfl:  .  calcitRIOL (ROCALTROL) 0.25 MCG capsule, Take 0.25 mcg by mouth every evening. , Disp: , Rfl:  .  carvedilol (COREG) 6.25 MG tablet, 1 tablet twice a day, Disp: 180 tablet, Rfl: 3 .  finasteride (PROSCAR) 5 MG tablet, Take 5 mg by mouth daily., Disp: , Rfl:  .  furosemide (LASIX) 40 MG tablet, Take 40 mg by mouth 3 (three) times a week. Mon, Wed, Fri, Disp: , Rfl:  .  lamoTRIgine (LAMICTAL) 25 MG tablet, Take 25 mg by mouth 2 (two) times daily., Disp: , Rfl:  .  levothyroxine (SYNTHROID, LEVOTHROID) 112 MCG tablet, Take 112 mcg by mouth daily before breakfast. , Disp: , Rfl:  .  Magnesium Oxide 500 MG CAPS, Take by mouth., Disp: , Rfl:  .  metroNIDAZOLE (METROGEL) 0.75 % gel, Apply 1 application topically 2 (two) times daily., Disp: , Rfl:  .  omeprazole (PRILOSEC) 20 MG capsule, Take 20 mg by mouth every morning. , Disp: ,  Rfl:  .  simvastatin (ZOCOR) 20 MG tablet, Take 20 mg by mouth at bedtime. , Disp: , Rfl:  .  tadalafil (CIALIS) 5 MG tablet, Take 5 mg by mouth as needed for erectile dysfunction., Disp: , Rfl:  .  temazepam (RESTORIL) 15 MG capsule, Take 15-30 mg by mouth at bedtime as needed. For sleep, Disp: , Rfl:  .  vitamin B-12 (CYANOCOBALAMIN) 100 MCG tablet, , Disp: , Rfl:  .  warfarin (COUMADIN) 5 MG tablet, Take 5 mg by mouth See admin instructions. Take 5 mg on sun, tue, wed, fri, and sat, Disp: , Rfl:  .  amLODipine (NORVASC) 10 MG tablet, 1 tablet daily (Patient not taking: Reported on 04/29/2015), Disp: 90 tablet, Rfl: 3 .  cholecalciferol (VITAMIN D) 1000 UNITS tablet, Take 1,000 Units by mouth 2 (two) times daily. Vitamin d3, Disp: , Rfl:  .  iron dextran complex in sodium chloride 0.9 % 500 mL, Inject into the vein once., Disp: , Rfl:   Filed Vitals:   04/29/15 1323   BP: 102/66  Pulse: 65  Temp: 97.2 F (36.2 C)  Resp: 18  Height: 5\' 11"  (1.803 m)  Weight: 218 lb (98.884 kg)  SpO2: 95%    Body mass index is 30.42 kg/(m^2).           Review of Systems Denies chest pain or dyspnea on exertion. Has no PND orthopnea or hemoptysis. Patient does have sleep apnea. Remote history of coronary artery disease and GERD. Patient has history of A. Fib and flutter and is currently on Coumadin. Has had previous ablation procedure. Also history of bipolar disorder. Previous left knee replacement due to osteoarthritisOther systems negative and complete review of systems     Objective:   Physical Exam BP 102/66 mmHg  Pulse 65  Temp(Src) 97.2 F (36.2 C)  Resp 18  Ht 5\' 11"  (1.803 m)  Wt 218 lb (98.884 kg)  BMI 30.42 kg/m2  SpO2 95%  Gen.-alert and oriented x3 in no apparent distress HEENT normal for age Lungs no rhonchi or wheezing Cardiovascular regular rhythm no murmurs carotid pulses 3+ palpable no bruits audible Abdomen soft nontender no palpable masses Musculoskeletal free of  major deformities Skin clear -no rashes Neurologic normal Lower extremities 3+ femoral and dorsalis pedis pulses palpable bilaterally with no edema   today I ordered vein mapping of both upper extremities and an arterial study. The arterial study is normal with triphasic flow in the radial and ulnar arteries bilaterally. Vein mapping reveals what appears to be a satisfactory cephalic vein in the left upper arm with the forearm being marginal. Right arm has a good cephalic vein and good basilic vein.    also performed a bedside independent sinusitis-ultrasound exam and confirmed that the left upper arm cephalic vein does appear to be adequate for brachial cephalic AV fistula creation     Assessment:      chronic kidney disease stage IV-needs vascular access as backup for peritoneal dialysis -has not started on dialysis at this time  bipolar disorder in the past   history of A. Fib/flutter currently on Coumadin has been treated with ablation  osteoarthritis with previous left knee replacement  GERD     Plan:      plan left brachial-cephalic AV fistula on Wednesday August 17   patient will discontinue his Coumadin after Thursday, August 11 and resume the evening of surgery  potential failure to mature of the fistula was discussed and possible need of other  procedures and patient understands this but would like to proceed

## 2015-05-05 ENCOUNTER — Encounter (HOSPITAL_COMMUNITY): Payer: Self-pay | Admitting: *Deleted

## 2015-05-05 DIAGNOSIS — N184 Chronic kidney disease, stage 4 (severe): Secondary | ICD-10-CM | POA: Diagnosis not present

## 2015-05-06 ENCOUNTER — Encounter: Payer: Self-pay | Admitting: Nephrology

## 2015-05-06 ENCOUNTER — Encounter (HOSPITAL_COMMUNITY): Payer: Self-pay | Admitting: Vascular Surgery

## 2015-05-06 MED ORDER — SODIUM CHLORIDE 0.9 % IV SOLN: INTRAVENOUS | Status: DC

## 2015-05-06 MED ORDER — DEXTROSE 5 % IV SOLN
1.5000 g | INTRAVENOUS | Status: AC
  Administered 2015-05-07: 1.5 g via INTRAVENOUS
  Filled 2015-05-06: qty 1.5

## 2015-05-06 MED ORDER — CHLORHEXIDINE GLUCONATE CLOTH 2 % EX PADS: 6.0000 | MEDICATED_PAD | Freq: Once | CUTANEOUS | Status: DC

## 2015-05-06 NOTE — Progress Notes (Signed)
Anesthesia Chart Review: SAME DAY WORK-UP.  Patient is a 81 year old male scheduled for left brachiocephalic AVF on 1/91/47 by Dr. Kellie Simmering. He is not yet on hemodialysis. He is planning to use peritoneal dialysis, but needed hemodialysis access for back-up.   History includes smoker, atrial fibrillation/flutter status post ablation 09/28/11 (Dr. Rayann Heman), GERD, hypertension, hypothyroidism, bipolar disorder, hyperparathyroidism, CKD stage IV (Dr. Posey Pronto), obstructive sleep apnea, depression, anxiety, PTSD, OSA on CPAP, anemia, pericarditis (age 5), CAD (by cardiology notes, records from [place] showed a 2007 CTA showing a 60% proximal LAD stenosis with normal nuclear stress test 08/20/09), bilateral TKA, L4-5 laminectomy '13. PCP is Dr. Dwaine Deter.  Meds include amiodarone, amlodipine, amoxicillin, Coreg, Proscar, Lasix, Lamictal, levothyroxine, Mag Ox, Prilosec, Zocor, Cialis, Effexor XR, warfarin. He was to hold Coumadin starting 05/01/15.   His primary Cardiologist is Dr. Irish Lack, last visit 01/08/14. Patient was exercising 4-5X/week at that time but with decreased stamina (not able to get HR up). He decreased patient's b-blocker. His note indicates he would not plan on cardiac cath at that time due to patient's CKD. Per PAT phone interviewing RN, patient continues to deny chest pain. He is till working a few days at [employer], but is not as active due to generalized fatigue.   His last EKG in Epic from 01/03/14 showed marked SB, right BBB. Rate was slower, but EKG overall stable when compared to 08/19/12. He will need an EKG on arrival to ensure no acute changes.   Echo on 09/20/11 [facility]) showed left ventricular ejection fraction 45-50%, aortic valve was sclerotic but open well, mild aortic root dilatation (3.9 cm), dilated atria, atrial septal aneurysm.  Nuclear stress test on 08/20/09 [facility]) showed normal myocardial perfusion scan demonstrating an attenuation artifact in inferior region of the  myocardium. No ischemia or infarct/scar is seen in the remaining myocardium. Post stress EF 62%.  10/04/14 CXR: IMPRESSION: No active cardiopulmonary disease.  He is for labs and EKG on arrival. Further review of these results and evaluation by his anesthesiologist on arrival. If results are felt acceptable/stable and no new CV/CHF or arrhythmia issues then I would anticipate that he could proceed as planned. Reviewed with anesthesiologist Dr. Deatra Canter.  [facility]/Anesthesiology Phone [phone] 05/06/2015 1:23 PM

## 2015-05-07 ENCOUNTER — Ambulatory Visit (HOSPITAL_COMMUNITY): Payer: Commercial Managed Care - HMO | Admitting: Vascular Surgery

## 2015-05-07 ENCOUNTER — Other Ambulatory Visit: Payer: Commercial Managed Care - HMO | Admitting: *Deleted

## 2015-05-07 ENCOUNTER — Encounter (HOSPITAL_COMMUNITY)
Admission: RE | Disposition: A | Discharge status: Home / Self Care | Payer: Commercial Managed Care - HMO | Source: Ambulatory Visit | Attending: Vascular Surgery

## 2015-05-07 ENCOUNTER — Ambulatory Visit (HOSPITAL_COMMUNITY)
Admission: RE | Admit: 2015-05-07 | Discharge: 2015-05-07 | Disposition: A | Discharge status: Home / Self Care | Payer: Commercial Managed Care - HMO | Source: Ambulatory Visit | Attending: Vascular Surgery | Admitting: Vascular Surgery

## 2015-05-07 ENCOUNTER — Encounter (HOSPITAL_COMMUNITY): Payer: Self-pay | Admitting: *Deleted

## 2015-05-07 DIAGNOSIS — M1990 Unspecified osteoarthritis, unspecified site: Secondary | ICD-10-CM | POA: Insufficient documentation

## 2015-05-07 DIAGNOSIS — Z96653 Presence of artificial knee joint, bilateral: Secondary | ICD-10-CM | POA: Insufficient documentation

## 2015-05-07 DIAGNOSIS — F329 Major depressive disorder, single episode, unspecified: Secondary | ICD-10-CM | POA: Insufficient documentation

## 2015-05-07 DIAGNOSIS — I129 Hypertensive chronic kidney disease with stage 1 through stage 4 chronic kidney disease, or unspecified chronic kidney disease: Secondary | ICD-10-CM | POA: Diagnosis not present

## 2015-05-07 DIAGNOSIS — F419 Anxiety disorder, unspecified: Secondary | ICD-10-CM | POA: Insufficient documentation

## 2015-05-07 DIAGNOSIS — K219 Gastro-esophageal reflux disease without esophagitis: Secondary | ICD-10-CM | POA: Diagnosis not present

## 2015-05-07 DIAGNOSIS — N184 Chronic kidney disease, stage 4 (severe): Secondary | ICD-10-CM | POA: Diagnosis not present

## 2015-05-07 DIAGNOSIS — D649 Anemia, unspecified: Secondary | ICD-10-CM | POA: Diagnosis not present

## 2015-05-07 DIAGNOSIS — F319 Bipolar disorder, unspecified: Secondary | ICD-10-CM | POA: Insufficient documentation

## 2015-05-07 DIAGNOSIS — G4733 Obstructive sleep apnea (adult) (pediatric): Secondary | ICD-10-CM | POA: Diagnosis not present

## 2015-05-07 DIAGNOSIS — F4310 Post-traumatic stress disorder, unspecified: Secondary | ICD-10-CM | POA: Insufficient documentation

## 2015-05-07 DIAGNOSIS — Z4931 Encounter for adequacy testing for hemodialysis: Secondary | ICD-10-CM

## 2015-05-07 DIAGNOSIS — F17200 Nicotine dependence, unspecified, uncomplicated: Secondary | ICD-10-CM | POA: Diagnosis not present

## 2015-05-07 DIAGNOSIS — E039 Hypothyroidism, unspecified: Secondary | ICD-10-CM | POA: Insufficient documentation

## 2015-05-07 DIAGNOSIS — N186 End stage renal disease: Secondary | ICD-10-CM

## 2015-05-07 HISTORY — PX: AV FISTULA PLACEMENT: SHX1204

## 2015-05-07 HISTORY — DX: Post-traumatic stress disorder, unspecified: F43.10

## 2015-05-07 LAB — PROTIME-INR
INR: 1.26 (ref 0.00–1.49)
Prothrombin Time: 16 seconds — ABNORMAL HIGH (ref 11.6–15.2)

## 2015-05-07 LAB — POCT I-STAT 4, (NA,K, GLUC, HGB,HCT)
Glucose, Bld: 95 mg/dL (ref 65–99)
HCT: 32 % — ABNORMAL LOW (ref 39.0–52.0)
HEMOGLOBIN: 10.9 g/dL — AB (ref 13.0–17.0)
POTASSIUM: 4.6 mmol/L (ref 3.5–5.1)
Sodium: 141 mmol/L (ref 135–145)

## 2015-05-07 LAB — APTT: APTT: 29 s (ref 24–37)

## 2015-05-07 SURGERY — ARTERIOVENOUS (AV) FISTULA CREATION
Anesthesia: Monitor Anesthesia Care | Site: Arm Upper | Laterality: Left

## 2015-05-07 MED ORDER — LIDOCAINE HCL (CARDIAC) 20 MG/ML IV SOLN
INTRAVENOUS | Status: DC | PRN
  Administered 2015-05-07: 50 mg via INTRAVENOUS

## 2015-05-07 MED ORDER — SODIUM CHLORIDE 0.9 % IR SOLN
Status: DC | PRN
  Administered 2015-05-07: 500 mL

## 2015-05-07 MED ORDER — LIDOCAINE-EPINEPHRINE (PF) 1 %-1:200000 IJ SOLN
INTRAMUSCULAR | Status: AC
  Filled 2015-05-07: qty 30

## 2015-05-07 MED ORDER — PROPOFOL INFUSION 10 MG/ML OPTIME
INTRAVENOUS | Status: DC | PRN
  Administered 2015-05-07: 100 ug/kg/min via INTRAVENOUS

## 2015-05-07 MED ORDER — ONDANSETRON HCL 4 MG/2ML IJ SOLN
INTRAMUSCULAR | Status: DC | PRN
  Administered 2015-05-07: 4 mg via INTRAVENOUS

## 2015-05-07 MED ORDER — LIDOCAINE-EPINEPHRINE (PF) 1 %-1:200000 IJ SOLN
INTRAMUSCULAR | Status: DC | PRN
  Administered 2015-05-07: 16 mL

## 2015-05-07 MED ORDER — LIDOCAINE HCL (PF) 1 % IJ SOLN
INTRAMUSCULAR | Status: AC
  Filled 2015-05-07: qty 30

## 2015-05-07 MED ORDER — 0.9 % SODIUM CHLORIDE (POUR BTL) OPTIME
TOPICAL | Status: DC | PRN
  Administered 2015-05-07: 1000 mL

## 2015-05-07 MED ORDER — FENTANYL CITRATE (PF) 250 MCG/5ML IJ SOLN
INTRAMUSCULAR | Status: AC
  Filled 2015-05-07: qty 5

## 2015-05-07 MED ORDER — FENTANYL CITRATE (PF) 100 MCG/2ML IJ SOLN: 25.0000 ug | INTRAMUSCULAR | Status: DC | PRN

## 2015-05-07 MED ORDER — OXYCODONE-ACETAMINOPHEN 5-325 MG PO TABS: 1.0000 | ORAL_TABLET | Freq: Four times a day (QID) | ORAL | Status: AC | PRN

## 2015-05-07 MED ORDER — FENTANYL CITRATE (PF) 250 MCG/5ML IJ SOLN
INTRAMUSCULAR | Status: DC | PRN
  Administered 2015-05-07: 50 ug via INTRAVENOUS

## 2015-05-07 SURGICAL SUPPLY — 32 items
ARMBAND PINK RESTRICT EXTREMIT (MISCELLANEOUS) ×3 IMPLANT
CANISTER SUCTION 2500CC (MISCELLANEOUS) ×3 IMPLANT
CLIP TI MEDIUM 6 (CLIP) ×3 IMPLANT
CLIP TI WIDE RED SMALL 6 (CLIP) ×3 IMPLANT
COVER PROBE W GEL 5X96 (DRAPES) ×3 IMPLANT
DRAIN PENROSE 1/4X12 LTX STRL (WOUND CARE) ×3 IMPLANT
ELECT REM PT RETURN 9FT ADLT (ELECTROSURGICAL) ×3
ELECTRODE REM PT RTRN 9FT ADLT (ELECTROSURGICAL) ×1 IMPLANT
GEL ULTRASOUND 20GR AQUASONIC (MISCELLANEOUS) IMPLANT
GLOVE BIO SURGEON STRL SZ 6.5 (GLOVE) ×2 IMPLANT
GLOVE BIO SURGEONS STRL SZ 6.5 (GLOVE) ×1
GLOVE BIOGEL PI IND STRL 6.5 (GLOVE) ×2 IMPLANT
GLOVE BIOGEL PI INDICATOR 6.5 (GLOVE) ×4
GLOVE SS BIOGEL STRL SZ 7 (GLOVE) ×1 IMPLANT
GLOVE SUPERSENSE BIOGEL SZ 7 (GLOVE) ×2
GLOVE SURG SS PI 6.5 STRL IVOR (GLOVE) ×3 IMPLANT
GLOVE SURG SS PI 7.0 STRL IVOR (GLOVE) ×3 IMPLANT
GOWN STRL REUS W/ TWL LRG LVL3 (GOWN DISPOSABLE) ×3 IMPLANT
GOWN STRL REUS W/ TWL XL LVL3 (GOWN DISPOSABLE) ×1 IMPLANT
GOWN STRL REUS W/TWL LRG LVL3 (GOWN DISPOSABLE) ×6
GOWN STRL REUS W/TWL XL LVL3 (GOWN DISPOSABLE) ×2
KIT BASIN OR (CUSTOM PROCEDURE TRAY) ×3 IMPLANT
KIT ROOM TURNOVER OR (KITS) ×3 IMPLANT
LIQUID BAND (GAUZE/BANDAGES/DRESSINGS) ×3 IMPLANT
NS IRRIG 1000ML POUR BTL (IV SOLUTION) ×3 IMPLANT
PACK CV ACCESS (CUSTOM PROCEDURE TRAY) ×3 IMPLANT
PAD ARMBOARD 7.5X6 YLW CONV (MISCELLANEOUS) ×6 IMPLANT
SUT PROLENE 6 0 BV (SUTURE) ×6 IMPLANT
SUT VIC AB 3-0 SH 27 (SUTURE) ×2
SUT VIC AB 3-0 SH 27X BRD (SUTURE) ×1 IMPLANT
UNDERPAD 30X30 INCONTINENT (UNDERPADS AND DIAPERS) ×3 IMPLANT
WATER STERILE IRR 1000ML POUR (IV SOLUTION) ×3 IMPLANT

## 2015-05-07 NOTE — Anesthesia Postprocedure Evaluation (Signed)
  Anesthesia Post-op Note  Patient: Tim Ponce  Procedure(s) Performed: Procedure(s): CREATION OF  BRACHIOCEPHALIC ARTERIOVENOUS (AV) FISTULA  LEFT ARM. (Left)  Patient Location: PACU  Anesthesia Type:MAC  Level of Consciousness: awake, alert , oriented and patient cooperative  Airway and Oxygen Therapy: Patient Spontanous Breathing  Post-op Pain: none  Post-op Assessment: Post-op Vital signs reviewed, Patient's Cardiovascular Status Stable, Respiratory Function Stable, Patent Airway, No signs of Nausea or vomiting and Pain level controlled              Post-op Vital Signs: Reviewed and stable  Last Vitals:  Filed Vitals:   05/07/15 1258  BP: 144/74  Pulse: 51  Temp:   Resp: 15    Complications: No apparent anesthesia complications

## 2015-05-07 NOTE — Op Note (Signed)
OPERATIVE REPORT  Date of Surgery: 05/07/2015  Surgeon: Tinnie Gens, MD  Assistant: Dorise Bullion  Pre-op Diagnosis:  Stage IV Chronic Kidney Disease N18.4  Post-op Diagnosis: Stage IV Chronic Kidney Disease N18.4  Procedure: Procedure(s): CREATION OF  BRACHIOCEPHALIC ARTERIOVENOUS (AV) FISTULA  LEFT ARM.  Anesthesia: Mac  EBL: Minimal  Complications: None  Procedure Details: The patient was taken down from room placed in supine position at which time left upper extremity was prepped with Betadine scrub and solution draped in routine sterile manner. Cephalic vein was imaged using B-mode ultrasound-sono site felt to be adequate in the upper arm. After infiltration with 1% Xylocaine with epinephrine transverse incision was made in the antecubital area antecubital vein dissected free. Cephalic branch was excellent being 3-1/2-4 mm in size. It was ligated distally transected gently dilated with heparinized saline. Brachial artery was exposed beneath the fascia encircled with Vesseloops. It was a 3 mm vessel with an excellent pulse. It was occluded proximally and distally opened 15 Blake stent with a Potts scissors. Brachial artery had excellent flow. The vein was carefully spatulated and anastomosed in the side was 60 proline. Vesseloops released there is no excellent pulse and palpable thrill in the fistula. No heparin or protamine was given. The fistula was also imaged with the ultrasound following creation of the fistula and no significant side branches were noted. There was slight diminution of radial arterial flow distally with the fistula open which improved with compression of the fistula. Adequate hemostasis was achieved and wound closed in layers with 5: A subcuticular fashion with Dermabond patient taken to the recovery room in satisfactory condition   Tinnie Gens, MD 05/07/2015 11:37 AM

## 2015-05-07 NOTE — Anesthesia Preprocedure Evaluation (Addendum)
Anesthesia Evaluation  Patient identified by MRN, date of birth, ID band Patient awake    Reviewed: Allergy & Precautions, NPO status , Patient's Chart, lab work & pertinent test results, reviewed documented beta blocker date and time   History of Anesthesia Complications Negative for: history of anesthetic complications  Airway Mallampati: II  TM Distance: >3 FB Neck ROM: Full    Dental  (+) Upper Dentures, Partial Lower, Dental Advisory Given   Pulmonary sleep apnea and Continuous Positive Airway Pressure Ventilation , Current Smoker,  breath sounds clear to auscultation        Cardiovascular hypertension, Pt. on medications and Pt. on home beta blockers - angina+ dysrhythmias (s/p ablation 2013) Atrial Fibrillation Rhythm:Irregular Rate:Normal  '12 ECHO: EF 45-50%, mild MR   Neuro/Psych PSYCHIATRIC DISORDERS (PTSD) Anxiety Depression Bipolar Disorder    GI/Hepatic Neg liver ROS, GERD-  Controlled,  Endo/Other  Hypothyroidism   Renal/GU ESRFRenal disease (K+ 4.6, no dialysis yet)     Musculoskeletal  (+) Arthritis -,   Abdominal   Peds  Hematology  (+) Blood dyscrasia (off of coumadin, INR 1.26), ,   Anesthesia Other Findings   Reproductive/Obstetrics                            Anesthesia Physical Anesthesia Plan  ASA: III  Anesthesia Plan: MAC   Post-op Pain Management:    Induction:   Airway Management Planned: Natural Airway and Nasal Cannula  Additional Equipment:   Intra-op Plan:   Post-operative Plan:   Informed Consent: I have reviewed the patients History and Physical, chart, labs and discussed the procedure including the risks, benefits and alternatives for the proposed anesthesia with the patient or authorized representative who has indicated his/her understanding and acceptance.   Dental advisory given  Plan Discussed with: CRNA and Surgeon  Anesthesia Plan  Comments: (Plan routine monitors, MAC)        Anesthesia Quick Evaluation

## 2015-05-07 NOTE — Transfer of Care (Signed)
Immediate Anesthesia Transfer of Care Note  Patient: Tim Ponce  Procedure(s) Performed: Procedure(s): CREATION OF  BRACHIOCEPHALIC ARTERIOVENOUS (AV) FISTULA  LEFT ARM. (Left)  Patient Location: PACU  Anesthesia Type:MAC  Level of Consciousness: awake, alert , oriented and patient cooperative  Airway & Oxygen Therapy: Patient Spontanous Breathing  Post-op Assessment: Report given to RN and Post -op Vital signs reviewed and stable  Post vital signs: Reviewed and stable  Last Vitals:  Filed Vitals:   05/07/15 0926  BP: 150/94  Pulse: 50  Temp: 36.7 C  Resp: 18    Complications: No apparent anesthesia complications

## 2015-05-08 ENCOUNTER — Encounter (HOSPITAL_COMMUNITY): Payer: Self-pay | Admitting: Vascular Surgery

## 2015-05-12 ENCOUNTER — Telehealth: Payer: Self-pay | Admitting: Vascular Surgery

## 2015-05-12 NOTE — Telephone Encounter (Signed)
-----   Message from Mena Goes, RN sent at 05/07/2015  1:45 PM EDT ----- Regarding: Schedule   ----- Message -----    From: Alvia Grove, PA-C    Sent: 05/07/2015  11:31 AM      To: [facility] Charge Pool  S/p left BC-AVF 05/07/15  F/u with Dr. Kellie Simmering in 6 weeks with duplex  Thanks Maudie Mercury

## 2015-05-12 NOTE — Telephone Encounter (Signed)
LM for pt re appt, dpm °

## 2015-05-15 DIAGNOSIS — R351 Nocturia: Secondary | ICD-10-CM | POA: Diagnosis not present

## 2015-05-15 DIAGNOSIS — N401 Enlarged prostate with lower urinary tract symptoms: Secondary | ICD-10-CM | POA: Diagnosis not present

## 2015-05-19 ENCOUNTER — Other Ambulatory Visit (HOSPITAL_COMMUNITY): Payer: Commercial Managed Care - HMO

## 2015-05-19 ENCOUNTER — Encounter (HOSPITAL_COMMUNITY): Payer: Commercial Managed Care - HMO

## 2015-05-19 ENCOUNTER — Ambulatory Visit: Payer: Commercial Managed Care - HMO | Admitting: Surgery

## 2015-05-19 DIAGNOSIS — K2270 Barrett's esophagus without dysplasia: Secondary | ICD-10-CM | POA: Diagnosis not present

## 2015-05-19 DIAGNOSIS — N5201 Erectile dysfunction due to arterial insufficiency: Secondary | ICD-10-CM | POA: Diagnosis not present

## 2015-05-19 DIAGNOSIS — Z7901 Long term (current) use of anticoagulants: Secondary | ICD-10-CM | POA: Diagnosis not present

## 2015-05-19 DIAGNOSIS — J309 Allergic rhinitis, unspecified: Secondary | ICD-10-CM | POA: Diagnosis not present

## 2015-05-19 DIAGNOSIS — M19031 Primary osteoarthritis, right wrist: Secondary | ICD-10-CM | POA: Diagnosis not present

## 2015-05-19 DIAGNOSIS — Z1389 Encounter for screening for other disorder: Secondary | ICD-10-CM | POA: Diagnosis not present

## 2015-05-19 DIAGNOSIS — I4891 Unspecified atrial fibrillation: Secondary | ICD-10-CM | POA: Diagnosis not present

## 2015-05-19 DIAGNOSIS — Z0001 Encounter for general adult medical examination with abnormal findings: Secondary | ICD-10-CM | POA: Diagnosis not present

## 2015-05-21 DIAGNOSIS — G4733 Obstructive sleep apnea (adult) (pediatric): Secondary | ICD-10-CM | POA: Diagnosis not present

## 2015-05-21 DIAGNOSIS — I129 Hypertensive chronic kidney disease with stage 1 through stage 4 chronic kidney disease, or unspecified chronic kidney disease: Secondary | ICD-10-CM | POA: Diagnosis not present

## 2015-05-21 DIAGNOSIS — N186 End stage renal disease: Secondary | ICD-10-CM | POA: Diagnosis not present

## 2015-05-21 DIAGNOSIS — N184 Chronic kidney disease, stage 4 (severe): Secondary | ICD-10-CM | POA: Diagnosis not present

## 2015-05-21 DIAGNOSIS — Z9989 Dependence on other enabling machines and devices: Secondary | ICD-10-CM | POA: Diagnosis not present

## 2015-05-21 DIAGNOSIS — E039 Hypothyroidism, unspecified: Secondary | ICD-10-CM | POA: Diagnosis not present

## 2015-05-21 DIAGNOSIS — Z7901 Long term (current) use of anticoagulants: Secondary | ICD-10-CM | POA: Diagnosis not present

## 2015-05-21 DIAGNOSIS — E785 Hyperlipidemia, unspecified: Secondary | ICD-10-CM | POA: Diagnosis not present

## 2015-05-21 DIAGNOSIS — I482 Chronic atrial fibrillation: Secondary | ICD-10-CM | POA: Diagnosis not present

## 2015-05-21 DIAGNOSIS — I2510 Atherosclerotic heart disease of native coronary artery without angina pectoris: Secondary | ICD-10-CM | POA: Diagnosis not present

## 2015-05-27 ENCOUNTER — Other Ambulatory Visit (HOSPITAL_COMMUNITY): Payer: Commercial Managed Care - HMO

## 2015-05-27 ENCOUNTER — Ambulatory Visit: Payer: Commercial Managed Care - HMO | Admitting: Vascular Surgery

## 2015-05-27 ENCOUNTER — Encounter (HOSPITAL_COMMUNITY): Payer: Commercial Managed Care - HMO

## 2015-05-28 NOTE — Patient Outreach (Signed)
[facility]) Care Management  05/28/2015  DAY DEERY 1931/06/13 [id number]   Referral from [place], assigned to Thea Silversmith, [facility] for patient outreach.  Jeanne Bleecker L. Margree Gimbel, [place] Care Management Assistant

## 2015-06-02 ENCOUNTER — Other Ambulatory Visit: Payer: Self-pay

## 2015-06-02 NOTE — Patient Outreach (Signed)
[name]) Care Management  06/02/2015  Tim Ponce Jan 08, 1931 [id number]  Subjective: "I think everything is good". Member reports [facility] has been real good about answering his questions and states he is going to contact [place] regarding out of pocket expense.   Assessment: Direct referral per [employer]: difficulty navigating healthcare system and questions regarding cost. RNCM spoke with member who reports he is preparing for Peritoneal dialysis. Member states he has gotten his questions answered through nephrologist and [facility](dialysis company). Member reports he does not have any questions/concerns for RNCM at this time. Member reports he is able to get his medications and has transportation. Member to follow up with [facility] regarding cost. Member denies any care management needs at this time. RNCM encouraged member to contact Care coordinator if needs change.  RNCM called referring nurse, Laurene Footman. Left message with update. Instructed to call if an additional needs or questions.  Plan: Close case.   Thea Silversmith, RN, MSN, Interior Coordinator Cell: [phone]

## 2015-06-11 DIAGNOSIS — D649 Anemia, unspecified: Secondary | ICD-10-CM | POA: Diagnosis not present

## 2015-06-11 DIAGNOSIS — N184 Chronic kidney disease, stage 4 (severe): Secondary | ICD-10-CM | POA: Diagnosis not present

## 2015-06-13 ENCOUNTER — Ambulatory Visit (HOSPITAL_COMMUNITY)
Admission: RE | Admit: 2015-06-13 | Discharge: 2015-06-13 | Disposition: A | Discharge status: Home / Self Care | Payer: Commercial Managed Care - HMO | Source: Ambulatory Visit | Attending: Surgery | Admitting: Surgery

## 2015-06-13 ENCOUNTER — Encounter (HOSPITAL_COMMUNITY): Payer: Commercial Managed Care - HMO

## 2015-06-13 DIAGNOSIS — N186 End stage renal disease: Secondary | ICD-10-CM | POA: Diagnosis not present

## 2015-06-13 DIAGNOSIS — Z4931 Encounter for adequacy testing for hemodialysis: Secondary | ICD-10-CM

## 2015-06-16 ENCOUNTER — Encounter: Payer: Self-pay | Admitting: Vascular Surgery

## 2015-06-16 DIAGNOSIS — N186 End stage renal disease: Secondary | ICD-10-CM | POA: Diagnosis not present

## 2015-06-17 ENCOUNTER — Ambulatory Visit (INDEPENDENT_AMBULATORY_CARE_PROVIDER_SITE_OTHER): Payer: Commercial Managed Care - HMO | Admitting: Vascular Surgery

## 2015-06-17 ENCOUNTER — Encounter: Payer: Self-pay | Admitting: Vascular Surgery

## 2015-06-17 VITALS — BP 126/70 | HR 56 | Temp 98.6°F | Ht 71.0 in | Wt 222.7 lb

## 2015-06-17 DIAGNOSIS — N186 End stage renal disease: Secondary | ICD-10-CM

## 2015-06-17 NOTE — Progress Notes (Signed)
Subjective:     Patient ID: Tim Ponce, male   DOB: Aug 24, 1931, 81 y.o.   MRN: [id number]  HPI this 81 year old male with end-stage renal disease is about to begin peritoneal dialysis. I created a left brachial-cephalic AV fistula 12/87/8676 as a backup. He denies any pain or numbness in the left hand and he has a good grip in the left hand.   Review of Systems     Objective:   Physical Exam BP 126/70 mmHg  Pulse 56  Temp(Src) 98.6 F (37 C) (Oral)  Ht 5\' 11"  (1.803 m)  Wt 222 lb 11.2 oz (101.016 kg)  BMI 31.07 kg/m2  SpO2 100%  Gen. well-developed well-nourished male in no apparent distress alert and oriented 3 Left upper extremity with well-healed antecubital incision. Excellent pulse and palpable thrill and brachial-cephalic AV fistula. 2+ radial pulse palpable distally with good perfusion left hand.  Today I ordered a duplex scan of the fistula which reveals excellent flow throughout the cephalic vein. There is mild elevation of flow near the anastomosis. The depth is about 0.25 cm throughout.     Assessment:     Nicely maturing left brachial-cephalic AV fistula in patient with end-stage renal disease who will undergo peritoneal dialysis    Plan:     Fistula cannot be utilized until 08/07/2015 if necessary Return to see me on when necessary basis

## 2015-06-18 DIAGNOSIS — N186 End stage renal disease: Secondary | ICD-10-CM | POA: Diagnosis not present

## 2015-06-19 ENCOUNTER — Ambulatory Visit
Admission: RE | Admit: 2015-06-19 | Discharge: 2015-06-19 | Disposition: A | Discharge status: Home / Self Care | Payer: Commercial Managed Care - HMO | Source: Ambulatory Visit | Attending: Nephrology | Admitting: Nephrology

## 2015-06-19 ENCOUNTER — Other Ambulatory Visit: Payer: Self-pay | Admitting: Nephrology

## 2015-06-19 DIAGNOSIS — Z4682 Encounter for fitting and adjustment of non-vascular catheter: Secondary | ICD-10-CM | POA: Diagnosis not present

## 2015-06-19 DIAGNOSIS — Z7901 Long term (current) use of anticoagulants: Secondary | ICD-10-CM | POA: Diagnosis not present

## 2015-06-19 DIAGNOSIS — T85611A Breakdown (mechanical) of intraperitoneal dialysis catheter, initial encounter: Secondary | ICD-10-CM

## 2015-06-19 DIAGNOSIS — I4891 Unspecified atrial fibrillation: Secondary | ICD-10-CM | POA: Diagnosis not present

## 2015-06-19 DIAGNOSIS — T85691D Other mechanical complication of intraperitoneal dialysis catheter, subsequent encounter: Secondary | ICD-10-CM | POA: Diagnosis not present

## 2015-06-23 DIAGNOSIS — I4891 Unspecified atrial fibrillation: Secondary | ICD-10-CM | POA: Diagnosis not present

## 2015-06-23 DIAGNOSIS — N184 Chronic kidney disease, stage 4 (severe): Secondary | ICD-10-CM | POA: Diagnosis not present

## 2015-06-23 DIAGNOSIS — Z7901 Long term (current) use of anticoagulants: Secondary | ICD-10-CM | POA: Diagnosis not present

## 2015-06-24 DIAGNOSIS — Z4902 Encounter for fitting and adjustment of peritoneal dialysis catheter: Secondary | ICD-10-CM | POA: Diagnosis not present

## 2015-06-24 DIAGNOSIS — N186 End stage renal disease: Secondary | ICD-10-CM | POA: Diagnosis not present

## 2015-06-24 DIAGNOSIS — I120 Hypertensive chronic kidney disease with stage 5 chronic kidney disease or end stage renal disease: Secondary | ICD-10-CM | POA: Diagnosis not present

## 2015-06-24 DIAGNOSIS — T85611A Breakdown (mechanical) of intraperitoneal dialysis catheter, initial encounter: Secondary | ICD-10-CM | POA: Diagnosis not present

## 2015-06-24 DIAGNOSIS — Z452 Encounter for adjustment and management of vascular access device: Secondary | ICD-10-CM | POA: Diagnosis not present

## 2015-06-24 DIAGNOSIS — I4891 Unspecified atrial fibrillation: Secondary | ICD-10-CM | POA: Diagnosis not present

## 2015-06-24 DIAGNOSIS — T85691D Other mechanical complication of intraperitoneal dialysis catheter, subsequent encounter: Secondary | ICD-10-CM | POA: Diagnosis not present

## 2015-06-24 DIAGNOSIS — E785 Hyperlipidemia, unspecified: Secondary | ICD-10-CM | POA: Diagnosis not present

## 2015-06-24 DIAGNOSIS — E039 Hypothyroidism, unspecified: Secondary | ICD-10-CM | POA: Diagnosis not present

## 2015-06-24 DIAGNOSIS — I2510 Atherosclerotic heart disease of native coronary artery without angina pectoris: Secondary | ICD-10-CM | POA: Diagnosis not present

## 2015-06-24 DIAGNOSIS — Z7901 Long term (current) use of anticoagulants: Secondary | ICD-10-CM | POA: Diagnosis not present

## 2015-06-24 DIAGNOSIS — F319 Bipolar disorder, unspecified: Secondary | ICD-10-CM | POA: Diagnosis not present

## 2015-06-26 DIAGNOSIS — N186 End stage renal disease: Secondary | ICD-10-CM | POA: Diagnosis not present

## 2015-06-27 DIAGNOSIS — N186 End stage renal disease: Secondary | ICD-10-CM | POA: Diagnosis not present

## 2015-06-30 DIAGNOSIS — N186 End stage renal disease: Secondary | ICD-10-CM | POA: Diagnosis not present

## 2015-07-01 DIAGNOSIS — Z5181 Encounter for therapeutic drug level monitoring: Secondary | ICD-10-CM | POA: Diagnosis not present

## 2015-07-01 DIAGNOSIS — M25539 Pain in unspecified wrist: Secondary | ICD-10-CM | POA: Diagnosis not present

## 2015-07-01 DIAGNOSIS — N186 End stage renal disease: Secondary | ICD-10-CM | POA: Diagnosis not present

## 2015-07-04 DIAGNOSIS — N186 End stage renal disease: Secondary | ICD-10-CM | POA: Diagnosis not present

## 2015-07-21 DIAGNOSIS — Z992 Dependence on renal dialysis: Secondary | ICD-10-CM | POA: Diagnosis not present

## 2015-07-21 DIAGNOSIS — N186 End stage renal disease: Secondary | ICD-10-CM | POA: Diagnosis not present

## 2015-07-21 DIAGNOSIS — I129 Hypertensive chronic kidney disease with stage 1 through stage 4 chronic kidney disease, or unspecified chronic kidney disease: Secondary | ICD-10-CM | POA: Diagnosis not present

## 2015-08-07 DIAGNOSIS — Z7901 Long term (current) use of anticoagulants: Secondary | ICD-10-CM | POA: Diagnosis not present

## 2015-08-15 DIAGNOSIS — N186 End stage renal disease: Secondary | ICD-10-CM | POA: Diagnosis not present

## 2015-08-16 DIAGNOSIS — N186 End stage renal disease: Secondary | ICD-10-CM | POA: Diagnosis not present

## 2015-08-17 DIAGNOSIS — N186 End stage renal disease: Secondary | ICD-10-CM | POA: Diagnosis not present

## 2015-08-18 DIAGNOSIS — N186 End stage renal disease: Secondary | ICD-10-CM | POA: Diagnosis not present

## 2015-08-18 DIAGNOSIS — R51 Headache: Secondary | ICD-10-CM | POA: Diagnosis not present

## 2015-08-19 DIAGNOSIS — N186 End stage renal disease: Secondary | ICD-10-CM | POA: Diagnosis not present

## 2015-08-20 DIAGNOSIS — Z992 Dependence on renal dialysis: Secondary | ICD-10-CM | POA: Diagnosis not present

## 2015-08-20 DIAGNOSIS — I129 Hypertensive chronic kidney disease with stage 1 through stage 4 chronic kidney disease, or unspecified chronic kidney disease: Secondary | ICD-10-CM | POA: Diagnosis not present

## 2015-08-20 DIAGNOSIS — N186 End stage renal disease: Secondary | ICD-10-CM | POA: Diagnosis not present

## 2015-08-21 DIAGNOSIS — N186 End stage renal disease: Secondary | ICD-10-CM | POA: Diagnosis not present

## 2015-08-22 DIAGNOSIS — N186 End stage renal disease: Secondary | ICD-10-CM | POA: Diagnosis not present

## 2015-08-23 DIAGNOSIS — N186 End stage renal disease: Secondary | ICD-10-CM | POA: Diagnosis not present

## 2015-08-24 DIAGNOSIS — N186 End stage renal disease: Secondary | ICD-10-CM | POA: Diagnosis not present

## 2015-08-25 DIAGNOSIS — N186 End stage renal disease: Secondary | ICD-10-CM | POA: Diagnosis not present

## 2015-08-26 DIAGNOSIS — N186 End stage renal disease: Secondary | ICD-10-CM | POA: Diagnosis not present

## 2015-08-27 DIAGNOSIS — N186 End stage renal disease: Secondary | ICD-10-CM | POA: Diagnosis not present

## 2015-08-28 DIAGNOSIS — N186 End stage renal disease: Secondary | ICD-10-CM | POA: Diagnosis not present

## 2015-08-29 DIAGNOSIS — N186 End stage renal disease: Secondary | ICD-10-CM | POA: Diagnosis not present

## 2015-08-30 DIAGNOSIS — N186 End stage renal disease: Secondary | ICD-10-CM | POA: Diagnosis not present

## 2015-08-31 DIAGNOSIS — N186 End stage renal disease: Secondary | ICD-10-CM | POA: Diagnosis not present

## 2015-09-01 DIAGNOSIS — N186 End stage renal disease: Secondary | ICD-10-CM | POA: Diagnosis not present

## 2015-09-02 DIAGNOSIS — N186 End stage renal disease: Secondary | ICD-10-CM | POA: Diagnosis not present

## 2015-09-03 DIAGNOSIS — N186 End stage renal disease: Secondary | ICD-10-CM | POA: Diagnosis not present

## 2015-09-04 DIAGNOSIS — N186 End stage renal disease: Secondary | ICD-10-CM | POA: Diagnosis not present

## 2015-09-04 DIAGNOSIS — F3131 Bipolar disorder, current episode depressed, mild: Secondary | ICD-10-CM | POA: Diagnosis not present

## 2015-09-05 DIAGNOSIS — N186 End stage renal disease: Secondary | ICD-10-CM | POA: Diagnosis not present

## 2015-09-06 DIAGNOSIS — N186 End stage renal disease: Secondary | ICD-10-CM | POA: Diagnosis not present

## 2015-09-07 DIAGNOSIS — N186 End stage renal disease: Secondary | ICD-10-CM | POA: Diagnosis not present

## 2015-09-08 DIAGNOSIS — N186 End stage renal disease: Secondary | ICD-10-CM | POA: Diagnosis not present

## 2015-09-09 DIAGNOSIS — N186 End stage renal disease: Secondary | ICD-10-CM | POA: Diagnosis not present

## 2015-09-10 DIAGNOSIS — N186 End stage renal disease: Secondary | ICD-10-CM | POA: Diagnosis not present

## 2015-09-11 DIAGNOSIS — Z7901 Long term (current) use of anticoagulants: Secondary | ICD-10-CM | POA: Diagnosis not present

## 2015-09-11 DIAGNOSIS — N186 End stage renal disease: Secondary | ICD-10-CM | POA: Diagnosis not present

## 2015-09-12 DIAGNOSIS — N186 End stage renal disease: Secondary | ICD-10-CM | POA: Diagnosis not present

## 2015-09-13 DIAGNOSIS — N186 End stage renal disease: Secondary | ICD-10-CM | POA: Diagnosis not present

## 2015-09-14 DIAGNOSIS — N186 End stage renal disease: Secondary | ICD-10-CM | POA: Diagnosis not present

## 2015-09-15 DIAGNOSIS — N186 End stage renal disease: Secondary | ICD-10-CM | POA: Diagnosis not present

## 2015-09-16 DIAGNOSIS — N186 End stage renal disease: Secondary | ICD-10-CM | POA: Diagnosis not present

## 2015-09-17 DIAGNOSIS — N186 End stage renal disease: Secondary | ICD-10-CM | POA: Diagnosis not present

## 2015-09-18 DIAGNOSIS — N186 End stage renal disease: Secondary | ICD-10-CM | POA: Diagnosis not present

## 2015-09-19 DIAGNOSIS — N186 End stage renal disease: Secondary | ICD-10-CM | POA: Diagnosis not present

## 2015-09-20 DIAGNOSIS — I129 Hypertensive chronic kidney disease with stage 1 through stage 4 chronic kidney disease, or unspecified chronic kidney disease: Secondary | ICD-10-CM | POA: Diagnosis not present

## 2015-09-20 DIAGNOSIS — N186 End stage renal disease: Secondary | ICD-10-CM | POA: Diagnosis not present

## 2015-09-20 DIAGNOSIS — Z992 Dependence on renal dialysis: Secondary | ICD-10-CM | POA: Diagnosis not present

## 2015-09-21 DIAGNOSIS — N186 End stage renal disease: Secondary | ICD-10-CM | POA: Diagnosis not present

## 2015-09-22 DIAGNOSIS — N186 End stage renal disease: Secondary | ICD-10-CM | POA: Diagnosis not present

## 2015-09-23 DIAGNOSIS — N186 End stage renal disease: Secondary | ICD-10-CM | POA: Diagnosis not present

## 2015-09-24 DIAGNOSIS — N186 End stage renal disease: Secondary | ICD-10-CM | POA: Diagnosis not present

## 2015-09-25 DIAGNOSIS — N186 End stage renal disease: Secondary | ICD-10-CM | POA: Diagnosis not present

## 2015-09-26 DIAGNOSIS — N186 End stage renal disease: Secondary | ICD-10-CM | POA: Diagnosis not present

## 2015-09-27 DIAGNOSIS — N186 End stage renal disease: Secondary | ICD-10-CM | POA: Diagnosis not present

## 2015-09-28 DIAGNOSIS — N186 End stage renal disease: Secondary | ICD-10-CM | POA: Diagnosis not present

## 2015-09-29 DIAGNOSIS — N186 End stage renal disease: Secondary | ICD-10-CM | POA: Diagnosis not present

## 2015-09-30 DIAGNOSIS — N186 End stage renal disease: Secondary | ICD-10-CM | POA: Diagnosis not present

## 2015-10-01 DIAGNOSIS — N186 End stage renal disease: Secondary | ICD-10-CM | POA: Diagnosis not present

## 2015-10-02 DIAGNOSIS — I871 Compression of vein: Secondary | ICD-10-CM | POA: Diagnosis not present

## 2015-10-02 DIAGNOSIS — Z992 Dependence on renal dialysis: Secondary | ICD-10-CM | POA: Diagnosis not present

## 2015-10-02 DIAGNOSIS — T82858D Stenosis of vascular prosthetic devices, implants and grafts, subsequent encounter: Secondary | ICD-10-CM | POA: Diagnosis not present

## 2015-10-02 DIAGNOSIS — N186 End stage renal disease: Secondary | ICD-10-CM | POA: Diagnosis not present

## 2015-10-03 DIAGNOSIS — N186 End stage renal disease: Secondary | ICD-10-CM | POA: Diagnosis not present

## 2015-10-04 DIAGNOSIS — N186 End stage renal disease: Secondary | ICD-10-CM | POA: Diagnosis not present

## 2015-10-05 DIAGNOSIS — N186 End stage renal disease: Secondary | ICD-10-CM | POA: Diagnosis not present

## 2015-10-06 DIAGNOSIS — N186 End stage renal disease: Secondary | ICD-10-CM | POA: Diagnosis not present

## 2015-10-07 DIAGNOSIS — N186 End stage renal disease: Secondary | ICD-10-CM | POA: Diagnosis not present

## 2015-10-08 DIAGNOSIS — N186 End stage renal disease: Secondary | ICD-10-CM | POA: Diagnosis not present

## 2015-10-09 DIAGNOSIS — N186 End stage renal disease: Secondary | ICD-10-CM | POA: Diagnosis not present

## 2015-10-10 DIAGNOSIS — N186 End stage renal disease: Secondary | ICD-10-CM | POA: Diagnosis not present

## 2015-10-11 DIAGNOSIS — N186 End stage renal disease: Secondary | ICD-10-CM | POA: Diagnosis not present

## 2015-10-12 DIAGNOSIS — N186 End stage renal disease: Secondary | ICD-10-CM | POA: Diagnosis not present

## 2015-10-13 DIAGNOSIS — N186 End stage renal disease: Secondary | ICD-10-CM | POA: Diagnosis not present

## 2015-10-14 DIAGNOSIS — N186 End stage renal disease: Secondary | ICD-10-CM | POA: Diagnosis not present

## 2015-10-15 DIAGNOSIS — N186 End stage renal disease: Secondary | ICD-10-CM | POA: Diagnosis not present

## 2015-10-16 DIAGNOSIS — Z7901 Long term (current) use of anticoagulants: Secondary | ICD-10-CM | POA: Diagnosis not present

## 2015-10-16 DIAGNOSIS — G4733 Obstructive sleep apnea (adult) (pediatric): Secondary | ICD-10-CM | POA: Diagnosis not present

## 2015-10-16 DIAGNOSIS — N186 End stage renal disease: Secondary | ICD-10-CM | POA: Diagnosis not present

## 2015-10-17 DIAGNOSIS — N186 End stage renal disease: Secondary | ICD-10-CM | POA: Diagnosis not present

## 2015-10-18 DIAGNOSIS — N186 End stage renal disease: Secondary | ICD-10-CM | POA: Diagnosis not present

## 2015-10-19 DIAGNOSIS — N186 End stage renal disease: Secondary | ICD-10-CM | POA: Diagnosis not present

## 2015-10-20 DIAGNOSIS — N186 End stage renal disease: Secondary | ICD-10-CM | POA: Diagnosis not present

## 2015-10-21 DIAGNOSIS — N186 End stage renal disease: Secondary | ICD-10-CM | POA: Diagnosis not present

## 2015-10-21 DIAGNOSIS — Z992 Dependence on renal dialysis: Secondary | ICD-10-CM | POA: Diagnosis not present

## 2015-10-21 DIAGNOSIS — I129 Hypertensive chronic kidney disease with stage 1 through stage 4 chronic kidney disease, or unspecified chronic kidney disease: Secondary | ICD-10-CM | POA: Diagnosis not present

## 2015-10-22 DIAGNOSIS — N186 End stage renal disease: Secondary | ICD-10-CM | POA: Diagnosis not present

## 2015-10-23 DIAGNOSIS — N186 End stage renal disease: Secondary | ICD-10-CM | POA: Diagnosis not present

## 2015-10-24 DIAGNOSIS — N186 End stage renal disease: Secondary | ICD-10-CM | POA: Diagnosis not present

## 2015-10-25 DIAGNOSIS — N186 End stage renal disease: Secondary | ICD-10-CM | POA: Diagnosis not present

## 2015-10-26 DIAGNOSIS — N186 End stage renal disease: Secondary | ICD-10-CM | POA: Diagnosis not present

## 2015-10-27 DIAGNOSIS — N186 End stage renal disease: Secondary | ICD-10-CM | POA: Diagnosis not present

## 2015-10-28 DIAGNOSIS — N186 End stage renal disease: Secondary | ICD-10-CM | POA: Diagnosis not present

## 2015-10-29 DIAGNOSIS — N186 End stage renal disease: Secondary | ICD-10-CM | POA: Diagnosis not present

## 2015-10-30 DIAGNOSIS — N186 End stage renal disease: Secondary | ICD-10-CM | POA: Diagnosis not present

## 2015-10-31 DIAGNOSIS — N186 End stage renal disease: Secondary | ICD-10-CM | POA: Diagnosis not present

## 2015-11-01 DIAGNOSIS — N186 End stage renal disease: Secondary | ICD-10-CM | POA: Diagnosis not present

## 2015-11-02 DIAGNOSIS — N186 End stage renal disease: Secondary | ICD-10-CM | POA: Diagnosis not present

## 2015-11-03 DIAGNOSIS — Z7901 Long term (current) use of anticoagulants: Secondary | ICD-10-CM | POA: Diagnosis not present

## 2015-11-03 DIAGNOSIS — N186 End stage renal disease: Secondary | ICD-10-CM | POA: Diagnosis not present

## 2015-11-04 DIAGNOSIS — N186 End stage renal disease: Secondary | ICD-10-CM | POA: Diagnosis not present

## 2015-11-05 DIAGNOSIS — N186 End stage renal disease: Secondary | ICD-10-CM | POA: Diagnosis not present

## 2015-11-06 DIAGNOSIS — N186 End stage renal disease: Secondary | ICD-10-CM | POA: Diagnosis not present

## 2015-11-07 DIAGNOSIS — N186 End stage renal disease: Secondary | ICD-10-CM | POA: Diagnosis not present

## 2015-11-08 DIAGNOSIS — N186 End stage renal disease: Secondary | ICD-10-CM | POA: Diagnosis not present

## 2015-11-09 DIAGNOSIS — N186 End stage renal disease: Secondary | ICD-10-CM | POA: Diagnosis not present

## 2015-11-10 DIAGNOSIS — N186 End stage renal disease: Secondary | ICD-10-CM | POA: Diagnosis not present

## 2015-11-11 DIAGNOSIS — N186 End stage renal disease: Secondary | ICD-10-CM | POA: Diagnosis not present

## 2015-11-12 DIAGNOSIS — N186 End stage renal disease: Secondary | ICD-10-CM | POA: Diagnosis not present

## 2015-11-13 DIAGNOSIS — N186 End stage renal disease: Secondary | ICD-10-CM | POA: Diagnosis not present

## 2015-11-14 DIAGNOSIS — N186 End stage renal disease: Secondary | ICD-10-CM | POA: Diagnosis not present

## 2015-11-15 DIAGNOSIS — N186 End stage renal disease: Secondary | ICD-10-CM | POA: Diagnosis not present

## 2015-11-16 DIAGNOSIS — N186 End stage renal disease: Secondary | ICD-10-CM | POA: Diagnosis not present

## 2015-11-17 DIAGNOSIS — L821 Other seborrheic keratosis: Secondary | ICD-10-CM | POA: Diagnosis not present

## 2015-11-17 DIAGNOSIS — N186 End stage renal disease: Secondary | ICD-10-CM | POA: Diagnosis not present

## 2015-11-17 DIAGNOSIS — D692 Other nonthrombocytopenic purpura: Secondary | ICD-10-CM | POA: Diagnosis not present

## 2015-11-17 DIAGNOSIS — L812 Freckles: Secondary | ICD-10-CM | POA: Diagnosis not present

## 2015-11-17 DIAGNOSIS — L570 Actinic keratosis: Secondary | ICD-10-CM | POA: Diagnosis not present

## 2015-11-17 DIAGNOSIS — D1801 Hemangioma of skin and subcutaneous tissue: Secondary | ICD-10-CM | POA: Diagnosis not present

## 2015-11-18 DIAGNOSIS — Z992 Dependence on renal dialysis: Secondary | ICD-10-CM | POA: Diagnosis not present

## 2015-11-18 DIAGNOSIS — N186 End stage renal disease: Secondary | ICD-10-CM | POA: Diagnosis not present

## 2015-11-18 DIAGNOSIS — I129 Hypertensive chronic kidney disease with stage 1 through stage 4 chronic kidney disease, or unspecified chronic kidney disease: Secondary | ICD-10-CM | POA: Diagnosis not present

## 2015-11-19 DIAGNOSIS — N186 End stage renal disease: Secondary | ICD-10-CM | POA: Diagnosis not present

## 2015-11-20 DIAGNOSIS — N186 End stage renal disease: Secondary | ICD-10-CM | POA: Diagnosis not present

## 2015-11-21 DIAGNOSIS — N186 End stage renal disease: Secondary | ICD-10-CM | POA: Diagnosis not present

## 2015-11-22 DIAGNOSIS — N186 End stage renal disease: Secondary | ICD-10-CM | POA: Diagnosis not present

## 2015-11-23 DIAGNOSIS — N186 End stage renal disease: Secondary | ICD-10-CM | POA: Diagnosis not present

## 2015-11-24 DIAGNOSIS — N186 End stage renal disease: Secondary | ICD-10-CM | POA: Diagnosis not present

## 2015-11-25 DIAGNOSIS — N186 End stage renal disease: Secondary | ICD-10-CM | POA: Diagnosis not present

## 2015-11-26 DIAGNOSIS — N186 End stage renal disease: Secondary | ICD-10-CM | POA: Diagnosis not present

## 2015-11-27 DIAGNOSIS — N186 End stage renal disease: Secondary | ICD-10-CM | POA: Diagnosis not present

## 2015-11-28 DIAGNOSIS — N186 End stage renal disease: Secondary | ICD-10-CM | POA: Diagnosis not present

## 2015-11-29 DIAGNOSIS — N186 End stage renal disease: Secondary | ICD-10-CM | POA: Diagnosis not present

## 2015-11-30 DIAGNOSIS — N186 End stage renal disease: Secondary | ICD-10-CM | POA: Diagnosis not present

## 2015-12-01 DIAGNOSIS — N186 End stage renal disease: Secondary | ICD-10-CM | POA: Diagnosis not present

## 2015-12-02 DIAGNOSIS — H353131 Nonexudative age-related macular degeneration, bilateral, early dry stage: Secondary | ICD-10-CM | POA: Diagnosis not present

## 2015-12-02 DIAGNOSIS — H26491 Other secondary cataract, right eye: Secondary | ICD-10-CM | POA: Diagnosis not present

## 2015-12-02 DIAGNOSIS — N186 End stage renal disease: Secondary | ICD-10-CM | POA: Diagnosis not present

## 2015-12-02 DIAGNOSIS — Z961 Presence of intraocular lens: Secondary | ICD-10-CM | POA: Diagnosis not present

## 2015-12-03 DIAGNOSIS — Z0000 Encounter for general adult medical examination without abnormal findings: Secondary | ICD-10-CM | POA: Diagnosis not present

## 2015-12-03 DIAGNOSIS — N186 End stage renal disease: Secondary | ICD-10-CM | POA: Diagnosis not present

## 2015-12-03 DIAGNOSIS — I4891 Unspecified atrial fibrillation: Secondary | ICD-10-CM | POA: Diagnosis not present

## 2015-12-03 DIAGNOSIS — N401 Enlarged prostate with lower urinary tract symptoms: Secondary | ICD-10-CM | POA: Diagnosis not present

## 2015-12-03 DIAGNOSIS — N138 Other obstructive and reflux uropathy: Secondary | ICD-10-CM | POA: Diagnosis not present

## 2015-12-03 DIAGNOSIS — Z7901 Long term (current) use of anticoagulants: Secondary | ICD-10-CM | POA: Diagnosis not present

## 2015-12-03 DIAGNOSIS — R351 Nocturia: Secondary | ICD-10-CM | POA: Diagnosis not present

## 2015-12-04 DIAGNOSIS — N186 End stage renal disease: Secondary | ICD-10-CM | POA: Diagnosis not present

## 2015-12-05 DIAGNOSIS — N186 End stage renal disease: Secondary | ICD-10-CM | POA: Diagnosis not present

## 2015-12-06 DIAGNOSIS — N186 End stage renal disease: Secondary | ICD-10-CM | POA: Diagnosis not present

## 2015-12-07 DIAGNOSIS — N186 End stage renal disease: Secondary | ICD-10-CM | POA: Diagnosis not present

## 2015-12-08 DIAGNOSIS — N186 End stage renal disease: Secondary | ICD-10-CM | POA: Diagnosis not present

## 2015-12-09 DIAGNOSIS — N186 End stage renal disease: Secondary | ICD-10-CM | POA: Diagnosis not present

## 2015-12-10 DIAGNOSIS — N186 End stage renal disease: Secondary | ICD-10-CM | POA: Diagnosis not present

## 2015-12-10 DIAGNOSIS — H26491 Other secondary cataract, right eye: Secondary | ICD-10-CM | POA: Diagnosis not present

## 2015-12-10 DIAGNOSIS — Z961 Presence of intraocular lens: Secondary | ICD-10-CM | POA: Diagnosis not present

## 2015-12-11 DIAGNOSIS — N186 End stage renal disease: Secondary | ICD-10-CM | POA: Diagnosis not present

## 2015-12-12 DIAGNOSIS — N186 End stage renal disease: Secondary | ICD-10-CM | POA: Diagnosis not present

## 2015-12-13 DIAGNOSIS — N186 End stage renal disease: Secondary | ICD-10-CM | POA: Diagnosis not present

## 2015-12-14 DIAGNOSIS — N186 End stage renal disease: Secondary | ICD-10-CM | POA: Diagnosis not present

## 2015-12-15 DIAGNOSIS — N186 End stage renal disease: Secondary | ICD-10-CM | POA: Diagnosis not present

## 2015-12-16 DIAGNOSIS — N186 End stage renal disease: Secondary | ICD-10-CM | POA: Diagnosis not present

## 2015-12-17 DIAGNOSIS — N186 End stage renal disease: Secondary | ICD-10-CM | POA: Diagnosis not present

## 2015-12-18 DIAGNOSIS — N186 End stage renal disease: Secondary | ICD-10-CM | POA: Diagnosis not present

## 2015-12-19 DIAGNOSIS — I129 Hypertensive chronic kidney disease with stage 1 through stage 4 chronic kidney disease, or unspecified chronic kidney disease: Secondary | ICD-10-CM | POA: Diagnosis not present

## 2015-12-19 DIAGNOSIS — Z992 Dependence on renal dialysis: Secondary | ICD-10-CM | POA: Diagnosis not present

## 2015-12-19 DIAGNOSIS — N186 End stage renal disease: Secondary | ICD-10-CM | POA: Diagnosis not present

## 2015-12-20 DIAGNOSIS — N186 End stage renal disease: Secondary | ICD-10-CM | POA: Diagnosis not present

## 2015-12-21 DIAGNOSIS — N186 End stage renal disease: Secondary | ICD-10-CM | POA: Diagnosis not present

## 2015-12-22 DIAGNOSIS — N186 End stage renal disease: Secondary | ICD-10-CM | POA: Diagnosis not present

## 2015-12-23 DIAGNOSIS — N186 End stage renal disease: Secondary | ICD-10-CM | POA: Diagnosis not present

## 2015-12-24 DIAGNOSIS — N186 End stage renal disease: Secondary | ICD-10-CM | POA: Diagnosis not present

## 2015-12-25 DIAGNOSIS — N186 End stage renal disease: Secondary | ICD-10-CM | POA: Diagnosis not present

## 2015-12-26 DIAGNOSIS — N186 End stage renal disease: Secondary | ICD-10-CM | POA: Diagnosis not present

## 2015-12-27 DIAGNOSIS — N186 End stage renal disease: Secondary | ICD-10-CM | POA: Diagnosis not present

## 2015-12-28 DIAGNOSIS — N186 End stage renal disease: Secondary | ICD-10-CM | POA: Diagnosis not present

## 2015-12-29 DIAGNOSIS — N186 End stage renal disease: Secondary | ICD-10-CM | POA: Diagnosis not present

## 2015-12-30 DIAGNOSIS — N186 End stage renal disease: Secondary | ICD-10-CM | POA: Diagnosis not present

## 2015-12-31 DIAGNOSIS — N186 End stage renal disease: Secondary | ICD-10-CM | POA: Diagnosis not present

## 2016-01-01 DIAGNOSIS — N186 End stage renal disease: Secondary | ICD-10-CM | POA: Diagnosis not present

## 2016-01-02 DIAGNOSIS — N186 End stage renal disease: Secondary | ICD-10-CM | POA: Diagnosis not present

## 2016-01-03 DIAGNOSIS — N186 End stage renal disease: Secondary | ICD-10-CM | POA: Diagnosis not present

## 2016-01-04 DIAGNOSIS — N186 End stage renal disease: Secondary | ICD-10-CM | POA: Diagnosis not present

## 2016-01-05 DIAGNOSIS — N186 End stage renal disease: Secondary | ICD-10-CM | POA: Diagnosis not present

## 2016-01-06 DIAGNOSIS — N186 End stage renal disease: Secondary | ICD-10-CM | POA: Diagnosis not present

## 2016-01-07 DIAGNOSIS — N186 End stage renal disease: Secondary | ICD-10-CM | POA: Diagnosis not present

## 2016-01-08 DIAGNOSIS — Z7901 Long term (current) use of anticoagulants: Secondary | ICD-10-CM | POA: Diagnosis not present

## 2016-01-08 DIAGNOSIS — N186 End stage renal disease: Secondary | ICD-10-CM | POA: Diagnosis not present

## 2016-01-09 DIAGNOSIS — N186 End stage renal disease: Secondary | ICD-10-CM | POA: Diagnosis not present

## 2016-01-10 DIAGNOSIS — N186 End stage renal disease: Secondary | ICD-10-CM | POA: Diagnosis not present

## 2016-01-11 DIAGNOSIS — N186 End stage renal disease: Secondary | ICD-10-CM | POA: Diagnosis not present

## 2016-01-12 DIAGNOSIS — N186 End stage renal disease: Secondary | ICD-10-CM | POA: Diagnosis not present

## 2016-01-13 DIAGNOSIS — N186 End stage renal disease: Secondary | ICD-10-CM | POA: Diagnosis not present

## 2016-01-14 DIAGNOSIS — N186 End stage renal disease: Secondary | ICD-10-CM | POA: Diagnosis not present

## 2016-01-15 DIAGNOSIS — N186 End stage renal disease: Secondary | ICD-10-CM | POA: Diagnosis not present

## 2016-01-16 DIAGNOSIS — N186 End stage renal disease: Secondary | ICD-10-CM | POA: Diagnosis not present

## 2016-01-17 DIAGNOSIS — N186 End stage renal disease: Secondary | ICD-10-CM | POA: Diagnosis not present

## 2016-01-18 DIAGNOSIS — I129 Hypertensive chronic kidney disease with stage 1 through stage 4 chronic kidney disease, or unspecified chronic kidney disease: Secondary | ICD-10-CM | POA: Diagnosis not present

## 2016-01-18 DIAGNOSIS — N186 End stage renal disease: Secondary | ICD-10-CM | POA: Diagnosis not present

## 2016-01-18 DIAGNOSIS — Z992 Dependence on renal dialysis: Secondary | ICD-10-CM | POA: Diagnosis not present

## 2016-01-19 DIAGNOSIS — N186 End stage renal disease: Secondary | ICD-10-CM | POA: Diagnosis not present

## 2016-01-20 DIAGNOSIS — N186 End stage renal disease: Secondary | ICD-10-CM | POA: Diagnosis not present

## 2016-01-21 DIAGNOSIS — N186 End stage renal disease: Secondary | ICD-10-CM | POA: Diagnosis not present

## 2016-01-22 DIAGNOSIS — N186 End stage renal disease: Secondary | ICD-10-CM | POA: Diagnosis not present

## 2016-01-23 DIAGNOSIS — N186 End stage renal disease: Secondary | ICD-10-CM | POA: Diagnosis not present

## 2016-01-24 DIAGNOSIS — N186 End stage renal disease: Secondary | ICD-10-CM | POA: Diagnosis not present

## 2016-01-25 DIAGNOSIS — N186 End stage renal disease: Secondary | ICD-10-CM | POA: Diagnosis not present

## 2016-01-26 DIAGNOSIS — N186 End stage renal disease: Secondary | ICD-10-CM | POA: Diagnosis not present

## 2016-01-27 DIAGNOSIS — N186 End stage renal disease: Secondary | ICD-10-CM | POA: Diagnosis not present

## 2016-01-28 DIAGNOSIS — N186 End stage renal disease: Secondary | ICD-10-CM | POA: Diagnosis not present

## 2016-01-29 DIAGNOSIS — N186 End stage renal disease: Secondary | ICD-10-CM | POA: Diagnosis not present

## 2016-01-30 DIAGNOSIS — N186 End stage renal disease: Secondary | ICD-10-CM | POA: Diagnosis not present

## 2016-01-31 DIAGNOSIS — N186 End stage renal disease: Secondary | ICD-10-CM | POA: Diagnosis not present

## 2016-02-01 DIAGNOSIS — N186 End stage renal disease: Secondary | ICD-10-CM | POA: Diagnosis not present

## 2016-02-02 DIAGNOSIS — N186 End stage renal disease: Secondary | ICD-10-CM | POA: Diagnosis not present

## 2016-02-03 DIAGNOSIS — N186 End stage renal disease: Secondary | ICD-10-CM | POA: Diagnosis not present

## 2016-02-04 DIAGNOSIS — N186 End stage renal disease: Secondary | ICD-10-CM | POA: Diagnosis not present

## 2016-02-05 DIAGNOSIS — N186 End stage renal disease: Secondary | ICD-10-CM | POA: Diagnosis not present

## 2016-02-06 DIAGNOSIS — N186 End stage renal disease: Secondary | ICD-10-CM | POA: Diagnosis not present

## 2016-02-07 DIAGNOSIS — N186 End stage renal disease: Secondary | ICD-10-CM | POA: Diagnosis not present

## 2016-02-08 DIAGNOSIS — N186 End stage renal disease: Secondary | ICD-10-CM | POA: Diagnosis not present

## 2016-02-09 DIAGNOSIS — N186 End stage renal disease: Secondary | ICD-10-CM | POA: Diagnosis not present

## 2016-02-10 DIAGNOSIS — N186 End stage renal disease: Secondary | ICD-10-CM | POA: Diagnosis not present

## 2016-02-11 DIAGNOSIS — N186 End stage renal disease: Secondary | ICD-10-CM | POA: Diagnosis not present

## 2016-02-12 DIAGNOSIS — F3131 Bipolar disorder, current episode depressed, mild: Secondary | ICD-10-CM | POA: Diagnosis not present

## 2016-02-12 DIAGNOSIS — Z7901 Long term (current) use of anticoagulants: Secondary | ICD-10-CM | POA: Diagnosis not present

## 2016-02-12 DIAGNOSIS — N186 End stage renal disease: Secondary | ICD-10-CM | POA: Diagnosis not present

## 2016-02-13 DIAGNOSIS — N186 End stage renal disease: Secondary | ICD-10-CM | POA: Diagnosis not present

## 2016-02-14 DIAGNOSIS — N186 End stage renal disease: Secondary | ICD-10-CM | POA: Diagnosis not present

## 2016-02-15 DIAGNOSIS — N186 End stage renal disease: Secondary | ICD-10-CM | POA: Diagnosis not present

## 2016-02-16 DIAGNOSIS — N186 End stage renal disease: Secondary | ICD-10-CM | POA: Diagnosis not present

## 2016-02-17 DIAGNOSIS — N186 End stage renal disease: Secondary | ICD-10-CM | POA: Diagnosis not present

## 2016-02-18 DIAGNOSIS — Z992 Dependence on renal dialysis: Secondary | ICD-10-CM | POA: Diagnosis not present

## 2016-02-18 DIAGNOSIS — I129 Hypertensive chronic kidney disease with stage 1 through stage 4 chronic kidney disease, or unspecified chronic kidney disease: Secondary | ICD-10-CM | POA: Diagnosis not present

## 2016-02-18 DIAGNOSIS — N186 End stage renal disease: Secondary | ICD-10-CM | POA: Diagnosis not present

## 2016-02-19 DIAGNOSIS — N186 End stage renal disease: Secondary | ICD-10-CM | POA: Diagnosis not present

## 2016-02-20 DIAGNOSIS — N186 End stage renal disease: Secondary | ICD-10-CM | POA: Diagnosis not present

## 2016-02-21 DIAGNOSIS — N186 End stage renal disease: Secondary | ICD-10-CM | POA: Diagnosis not present

## 2016-02-22 DIAGNOSIS — N186 End stage renal disease: Secondary | ICD-10-CM | POA: Diagnosis not present

## 2016-02-23 DIAGNOSIS — N186 End stage renal disease: Secondary | ICD-10-CM | POA: Diagnosis not present

## 2016-02-24 DIAGNOSIS — N186 End stage renal disease: Secondary | ICD-10-CM | POA: Diagnosis not present

## 2016-02-25 DIAGNOSIS — N186 End stage renal disease: Secondary | ICD-10-CM | POA: Diagnosis not present

## 2016-02-26 DIAGNOSIS — Z7901 Long term (current) use of anticoagulants: Secondary | ICD-10-CM | POA: Diagnosis not present

## 2016-02-26 DIAGNOSIS — N186 End stage renal disease: Secondary | ICD-10-CM | POA: Diagnosis not present

## 2016-02-27 DIAGNOSIS — N186 End stage renal disease: Secondary | ICD-10-CM | POA: Diagnosis not present

## 2016-02-28 DIAGNOSIS — N186 End stage renal disease: Secondary | ICD-10-CM | POA: Diagnosis not present

## 2016-02-29 DIAGNOSIS — N186 End stage renal disease: Secondary | ICD-10-CM | POA: Diagnosis not present

## 2016-03-01 DIAGNOSIS — N186 End stage renal disease: Secondary | ICD-10-CM | POA: Diagnosis not present

## 2016-03-02 DIAGNOSIS — N186 End stage renal disease: Secondary | ICD-10-CM | POA: Diagnosis not present

## 2016-03-03 DIAGNOSIS — N186 End stage renal disease: Secondary | ICD-10-CM | POA: Diagnosis not present

## 2016-03-04 DIAGNOSIS — N186 End stage renal disease: Secondary | ICD-10-CM | POA: Diagnosis not present

## 2016-03-05 DIAGNOSIS — N186 End stage renal disease: Secondary | ICD-10-CM | POA: Diagnosis not present

## 2016-03-06 DIAGNOSIS — N186 End stage renal disease: Secondary | ICD-10-CM | POA: Diagnosis not present

## 2016-03-07 DIAGNOSIS — N186 End stage renal disease: Secondary | ICD-10-CM | POA: Diagnosis not present

## 2016-03-08 DIAGNOSIS — N186 End stage renal disease: Secondary | ICD-10-CM | POA: Diagnosis not present

## 2016-03-09 DIAGNOSIS — N186 End stage renal disease: Secondary | ICD-10-CM | POA: Diagnosis not present

## 2016-03-10 DIAGNOSIS — N186 End stage renal disease: Secondary | ICD-10-CM | POA: Diagnosis not present

## 2016-03-11 DIAGNOSIS — N186 End stage renal disease: Secondary | ICD-10-CM | POA: Diagnosis not present

## 2016-03-12 DIAGNOSIS — N186 End stage renal disease: Secondary | ICD-10-CM | POA: Diagnosis not present

## 2016-03-13 DIAGNOSIS — N186 End stage renal disease: Secondary | ICD-10-CM | POA: Diagnosis not present

## 2016-03-14 DIAGNOSIS — N186 End stage renal disease: Secondary | ICD-10-CM | POA: Diagnosis not present

## 2016-03-15 DIAGNOSIS — N186 End stage renal disease: Secondary | ICD-10-CM | POA: Diagnosis not present

## 2016-03-16 DIAGNOSIS — N186 End stage renal disease: Secondary | ICD-10-CM | POA: Diagnosis not present

## 2016-03-17 DIAGNOSIS — N186 End stage renal disease: Secondary | ICD-10-CM | POA: Diagnosis not present

## 2016-03-18 DIAGNOSIS — N186 End stage renal disease: Secondary | ICD-10-CM | POA: Diagnosis not present

## 2016-03-19 DIAGNOSIS — N186 End stage renal disease: Secondary | ICD-10-CM | POA: Diagnosis not present

## 2016-03-20 DIAGNOSIS — N186 End stage renal disease: Secondary | ICD-10-CM | POA: Diagnosis not present

## 2016-03-21 DIAGNOSIS — N186 End stage renal disease: Secondary | ICD-10-CM | POA: Diagnosis not present

## 2016-03-22 DIAGNOSIS — N186 End stage renal disease: Secondary | ICD-10-CM | POA: Diagnosis not present

## 2016-03-23 DIAGNOSIS — N186 End stage renal disease: Secondary | ICD-10-CM | POA: Diagnosis not present

## 2016-03-24 DIAGNOSIS — N186 End stage renal disease: Secondary | ICD-10-CM | POA: Diagnosis not present

## 2016-03-24 DIAGNOSIS — N529 Male erectile dysfunction, unspecified: Secondary | ICD-10-CM | POA: Diagnosis not present

## 2016-03-24 DIAGNOSIS — I4891 Unspecified atrial fibrillation: Secondary | ICD-10-CM | POA: Diagnosis not present

## 2016-03-24 DIAGNOSIS — M25531 Pain in right wrist: Secondary | ICD-10-CM | POA: Diagnosis not present

## 2016-03-24 DIAGNOSIS — Z7901 Long term (current) use of anticoagulants: Secondary | ICD-10-CM | POA: Diagnosis not present

## 2016-03-25 DIAGNOSIS — N186 End stage renal disease: Secondary | ICD-10-CM | POA: Diagnosis not present

## 2016-03-26 DIAGNOSIS — N186 End stage renal disease: Secondary | ICD-10-CM | POA: Diagnosis not present

## 2016-03-27 DIAGNOSIS — N186 End stage renal disease: Secondary | ICD-10-CM | POA: Diagnosis not present

## 2016-03-28 DIAGNOSIS — N186 End stage renal disease: Secondary | ICD-10-CM | POA: Diagnosis not present

## 2016-03-29 DIAGNOSIS — N186 End stage renal disease: Secondary | ICD-10-CM | POA: Diagnosis not present

## 2016-03-30 DIAGNOSIS — N186 End stage renal disease: Secondary | ICD-10-CM | POA: Diagnosis not present

## 2016-03-31 DIAGNOSIS — N186 End stage renal disease: Secondary | ICD-10-CM | POA: Diagnosis not present

## 2016-04-01 DIAGNOSIS — N186 End stage renal disease: Secondary | ICD-10-CM | POA: Diagnosis not present

## 2016-04-02 DIAGNOSIS — N186 End stage renal disease: Secondary | ICD-10-CM | POA: Diagnosis not present

## 2016-04-03 DIAGNOSIS — N186 End stage renal disease: Secondary | ICD-10-CM | POA: Diagnosis not present

## 2016-04-04 DIAGNOSIS — N186 End stage renal disease: Secondary | ICD-10-CM | POA: Diagnosis not present

## 2016-04-05 DIAGNOSIS — N186 End stage renal disease: Secondary | ICD-10-CM | POA: Diagnosis not present

## 2016-04-06 DIAGNOSIS — N186 End stage renal disease: Secondary | ICD-10-CM | POA: Diagnosis not present

## 2016-04-07 DIAGNOSIS — N186 End stage renal disease: Secondary | ICD-10-CM | POA: Diagnosis not present

## 2016-04-08 DIAGNOSIS — N186 End stage renal disease: Secondary | ICD-10-CM | POA: Diagnosis not present

## 2016-04-09 DIAGNOSIS — N186 End stage renal disease: Secondary | ICD-10-CM | POA: Diagnosis not present

## 2016-04-10 DIAGNOSIS — N186 End stage renal disease: Secondary | ICD-10-CM | POA: Diagnosis not present

## 2016-04-11 DIAGNOSIS — N186 End stage renal disease: Secondary | ICD-10-CM | POA: Diagnosis not present

## 2016-04-12 DIAGNOSIS — N186 End stage renal disease: Secondary | ICD-10-CM | POA: Diagnosis not present

## 2016-04-13 DIAGNOSIS — N186 End stage renal disease: Secondary | ICD-10-CM | POA: Diagnosis not present

## 2016-04-14 DIAGNOSIS — N186 End stage renal disease: Secondary | ICD-10-CM | POA: Diagnosis not present

## 2016-04-15 DIAGNOSIS — N186 End stage renal disease: Secondary | ICD-10-CM | POA: Diagnosis not present

## 2016-04-16 DIAGNOSIS — N186 End stage renal disease: Secondary | ICD-10-CM | POA: Diagnosis not present

## 2016-04-17 DIAGNOSIS — N186 End stage renal disease: Secondary | ICD-10-CM | POA: Diagnosis not present

## 2016-04-18 DIAGNOSIS — N186 End stage renal disease: Secondary | ICD-10-CM | POA: Diagnosis not present

## 2016-04-19 DIAGNOSIS — I129 Hypertensive chronic kidney disease with stage 1 through stage 4 chronic kidney disease, or unspecified chronic kidney disease: Secondary | ICD-10-CM | POA: Diagnosis not present

## 2016-04-19 DIAGNOSIS — N186 End stage renal disease: Secondary | ICD-10-CM | POA: Diagnosis not present

## 2016-04-19 DIAGNOSIS — Z992 Dependence on renal dialysis: Secondary | ICD-10-CM | POA: Diagnosis not present

## 2016-04-20 DIAGNOSIS — N186 End stage renal disease: Secondary | ICD-10-CM | POA: Diagnosis not present

## 2016-04-21 DIAGNOSIS — N186 End stage renal disease: Secondary | ICD-10-CM | POA: Diagnosis not present

## 2016-04-21 DIAGNOSIS — Z7901 Long term (current) use of anticoagulants: Secondary | ICD-10-CM | POA: Diagnosis not present

## 2016-04-22 DIAGNOSIS — N186 End stage renal disease: Secondary | ICD-10-CM | POA: Diagnosis not present

## 2016-04-23 DIAGNOSIS — N186 End stage renal disease: Secondary | ICD-10-CM | POA: Diagnosis not present

## 2016-04-24 DIAGNOSIS — N186 End stage renal disease: Secondary | ICD-10-CM | POA: Diagnosis not present

## 2016-04-25 DIAGNOSIS — N186 End stage renal disease: Secondary | ICD-10-CM | POA: Diagnosis not present

## 2016-04-26 DIAGNOSIS — N186 End stage renal disease: Secondary | ICD-10-CM | POA: Diagnosis not present

## 2016-04-27 DIAGNOSIS — N186 End stage renal disease: Secondary | ICD-10-CM | POA: Diagnosis not present

## 2016-04-28 DIAGNOSIS — N186 End stage renal disease: Secondary | ICD-10-CM | POA: Diagnosis not present

## 2016-04-29 DIAGNOSIS — N186 End stage renal disease: Secondary | ICD-10-CM | POA: Diagnosis not present

## 2016-04-30 DIAGNOSIS — N186 End stage renal disease: Secondary | ICD-10-CM | POA: Diagnosis not present

## 2016-05-01 DIAGNOSIS — N186 End stage renal disease: Secondary | ICD-10-CM | POA: Diagnosis not present

## 2016-05-02 DIAGNOSIS — N186 End stage renal disease: Secondary | ICD-10-CM | POA: Diagnosis not present

## 2016-05-03 DIAGNOSIS — N186 End stage renal disease: Secondary | ICD-10-CM | POA: Diagnosis not present

## 2016-05-04 DIAGNOSIS — N186 End stage renal disease: Secondary | ICD-10-CM | POA: Diagnosis not present

## 2016-05-04 DIAGNOSIS — R12 Heartburn: Secondary | ICD-10-CM | POA: Diagnosis not present

## 2016-05-05 DIAGNOSIS — N186 End stage renal disease: Secondary | ICD-10-CM | POA: Diagnosis not present

## 2016-05-06 DIAGNOSIS — N186 End stage renal disease: Secondary | ICD-10-CM | POA: Diagnosis not present

## 2016-05-07 DIAGNOSIS — N186 End stage renal disease: Secondary | ICD-10-CM | POA: Diagnosis not present

## 2016-05-08 DIAGNOSIS — N186 End stage renal disease: Secondary | ICD-10-CM | POA: Diagnosis not present

## 2016-05-09 DIAGNOSIS — N186 End stage renal disease: Secondary | ICD-10-CM | POA: Diagnosis not present

## 2016-05-10 DIAGNOSIS — N186 End stage renal disease: Secondary | ICD-10-CM | POA: Diagnosis not present

## 2016-05-11 DIAGNOSIS — N186 End stage renal disease: Secondary | ICD-10-CM | POA: Diagnosis not present

## 2016-05-12 DIAGNOSIS — N186 End stage renal disease: Secondary | ICD-10-CM | POA: Diagnosis not present

## 2016-05-13 DIAGNOSIS — N186 End stage renal disease: Secondary | ICD-10-CM | POA: Diagnosis not present

## 2016-05-14 DIAGNOSIS — N186 End stage renal disease: Secondary | ICD-10-CM | POA: Diagnosis not present

## 2016-05-15 DIAGNOSIS — N186 End stage renal disease: Secondary | ICD-10-CM | POA: Diagnosis not present

## 2016-05-16 DIAGNOSIS — N186 End stage renal disease: Secondary | ICD-10-CM | POA: Diagnosis not present

## 2016-05-17 DIAGNOSIS — N186 End stage renal disease: Secondary | ICD-10-CM | POA: Diagnosis not present

## 2016-05-18 DIAGNOSIS — N186 End stage renal disease: Secondary | ICD-10-CM | POA: Diagnosis not present

## 2016-05-19 DIAGNOSIS — N186 End stage renal disease: Secondary | ICD-10-CM | POA: Diagnosis not present

## 2016-05-20 DIAGNOSIS — I129 Hypertensive chronic kidney disease with stage 1 through stage 4 chronic kidney disease, or unspecified chronic kidney disease: Secondary | ICD-10-CM | POA: Diagnosis not present

## 2016-05-20 DIAGNOSIS — N186 End stage renal disease: Secondary | ICD-10-CM | POA: Diagnosis not present

## 2016-05-20 DIAGNOSIS — Z992 Dependence on renal dialysis: Secondary | ICD-10-CM | POA: Diagnosis not present

## 2016-05-21 DIAGNOSIS — N186 End stage renal disease: Secondary | ICD-10-CM | POA: Diagnosis not present

## 2016-05-21 DIAGNOSIS — R05 Cough: Secondary | ICD-10-CM | POA: Diagnosis not present

## 2016-05-21 DIAGNOSIS — J209 Acute bronchitis, unspecified: Secondary | ICD-10-CM | POA: Diagnosis not present

## 2016-05-21 DIAGNOSIS — R0602 Shortness of breath: Secondary | ICD-10-CM | POA: Diagnosis not present

## 2016-05-22 DIAGNOSIS — N186 End stage renal disease: Secondary | ICD-10-CM | POA: Diagnosis not present

## 2016-05-23 DIAGNOSIS — N186 End stage renal disease: Secondary | ICD-10-CM | POA: Diagnosis not present

## 2016-05-24 DIAGNOSIS — N186 End stage renal disease: Secondary | ICD-10-CM | POA: Diagnosis not present

## 2016-05-25 DIAGNOSIS — N186 End stage renal disease: Secondary | ICD-10-CM | POA: Diagnosis not present

## 2016-05-26 DIAGNOSIS — N186 End stage renal disease: Secondary | ICD-10-CM | POA: Diagnosis not present

## 2016-05-27 DIAGNOSIS — N186 End stage renal disease: Secondary | ICD-10-CM | POA: Diagnosis not present

## 2016-05-28 DIAGNOSIS — N186 End stage renal disease: Secondary | ICD-10-CM | POA: Diagnosis not present

## 2016-05-29 DIAGNOSIS — N186 End stage renal disease: Secondary | ICD-10-CM | POA: Diagnosis not present

## 2016-05-30 DIAGNOSIS — N186 End stage renal disease: Secondary | ICD-10-CM | POA: Diagnosis not present

## 2016-05-31 DIAGNOSIS — N186 End stage renal disease: Secondary | ICD-10-CM | POA: Diagnosis not present

## 2016-06-01 DIAGNOSIS — N186 End stage renal disease: Secondary | ICD-10-CM | POA: Diagnosis not present

## 2016-06-02 DIAGNOSIS — N186 End stage renal disease: Secondary | ICD-10-CM | POA: Diagnosis not present

## 2016-06-03 DIAGNOSIS — N186 End stage renal disease: Secondary | ICD-10-CM | POA: Diagnosis not present

## 2016-06-04 DIAGNOSIS — N186 End stage renal disease: Secondary | ICD-10-CM | POA: Diagnosis not present

## 2016-06-05 DIAGNOSIS — N186 End stage renal disease: Secondary | ICD-10-CM | POA: Diagnosis not present

## 2016-06-06 DIAGNOSIS — N186 End stage renal disease: Secondary | ICD-10-CM | POA: Diagnosis not present

## 2016-06-07 DIAGNOSIS — N186 End stage renal disease: Secondary | ICD-10-CM | POA: Diagnosis not present

## 2016-06-08 DIAGNOSIS — N186 End stage renal disease: Secondary | ICD-10-CM | POA: Diagnosis not present

## 2016-06-09 DIAGNOSIS — N186 End stage renal disease: Secondary | ICD-10-CM | POA: Diagnosis not present

## 2016-06-10 DIAGNOSIS — N186 End stage renal disease: Secondary | ICD-10-CM | POA: Diagnosis not present

## 2016-06-11 DIAGNOSIS — N186 End stage renal disease: Secondary | ICD-10-CM | POA: Diagnosis not present

## 2016-06-12 DIAGNOSIS — N186 End stage renal disease: Secondary | ICD-10-CM | POA: Diagnosis not present

## 2016-06-13 DIAGNOSIS — N186 End stage renal disease: Secondary | ICD-10-CM | POA: Diagnosis not present

## 2016-06-14 DIAGNOSIS — N186 End stage renal disease: Secondary | ICD-10-CM | POA: Diagnosis not present

## 2016-06-15 DIAGNOSIS — N186 End stage renal disease: Secondary | ICD-10-CM | POA: Diagnosis not present

## 2016-06-16 DIAGNOSIS — N186 End stage renal disease: Secondary | ICD-10-CM | POA: Diagnosis not present

## 2016-06-17 DIAGNOSIS — N186 End stage renal disease: Secondary | ICD-10-CM | POA: Diagnosis not present

## 2016-06-18 DIAGNOSIS — N186 End stage renal disease: Secondary | ICD-10-CM | POA: Diagnosis not present

## 2016-06-19 DIAGNOSIS — I129 Hypertensive chronic kidney disease with stage 1 through stage 4 chronic kidney disease, or unspecified chronic kidney disease: Secondary | ICD-10-CM | POA: Diagnosis not present

## 2016-06-19 DIAGNOSIS — N186 End stage renal disease: Secondary | ICD-10-CM | POA: Diagnosis not present

## 2016-06-20 DIAGNOSIS — N186 End stage renal disease: Secondary | ICD-10-CM | POA: Diagnosis not present

## 2016-06-21 DIAGNOSIS — N186 End stage renal disease: Secondary | ICD-10-CM | POA: Diagnosis not present

## 2016-06-22 DIAGNOSIS — N186 End stage renal disease: Secondary | ICD-10-CM | POA: Diagnosis not present

## 2016-06-23 DIAGNOSIS — N186 End stage renal disease: Secondary | ICD-10-CM | POA: Diagnosis not present

## 2016-06-24 DIAGNOSIS — N186 End stage renal disease: Secondary | ICD-10-CM | POA: Diagnosis not present

## 2016-06-25 DIAGNOSIS — N186 End stage renal disease: Secondary | ICD-10-CM | POA: Diagnosis not present

## 2016-06-26 DIAGNOSIS — N186 End stage renal disease: Secondary | ICD-10-CM | POA: Diagnosis not present

## 2016-06-27 DIAGNOSIS — N186 End stage renal disease: Secondary | ICD-10-CM | POA: Diagnosis not present

## 2016-06-28 DIAGNOSIS — N186 End stage renal disease: Secondary | ICD-10-CM | POA: Diagnosis not present

## 2016-06-29 DIAGNOSIS — N186 End stage renal disease: Secondary | ICD-10-CM | POA: Diagnosis not present

## 2016-06-30 DIAGNOSIS — N186 End stage renal disease: Secondary | ICD-10-CM | POA: Diagnosis not present

## 2016-07-01 DIAGNOSIS — N186 End stage renal disease: Secondary | ICD-10-CM | POA: Diagnosis not present

## 2016-07-02 DIAGNOSIS — N186 End stage renal disease: Secondary | ICD-10-CM | POA: Diagnosis not present

## 2016-07-03 DIAGNOSIS — N186 End stage renal disease: Secondary | ICD-10-CM | POA: Diagnosis not present

## 2016-07-04 DIAGNOSIS — N186 End stage renal disease: Secondary | ICD-10-CM | POA: Diagnosis not present

## 2016-07-05 DIAGNOSIS — M47816 Spondylosis without myelopathy or radiculopathy, lumbar region: Secondary | ICD-10-CM | POA: Diagnosis not present

## 2016-07-05 DIAGNOSIS — R8299 Other abnormal findings in urine: Secondary | ICD-10-CM | POA: Diagnosis not present

## 2016-07-05 DIAGNOSIS — M545 Low back pain: Secondary | ICD-10-CM | POA: Diagnosis not present

## 2016-07-05 DIAGNOSIS — N186 End stage renal disease: Secondary | ICD-10-CM | POA: Diagnosis not present

## 2016-07-05 DIAGNOSIS — M4696 Unspecified inflammatory spondylopathy, lumbar region: Secondary | ICD-10-CM | POA: Diagnosis not present

## 2016-07-05 DIAGNOSIS — M62830 Muscle spasm of back: Secondary | ICD-10-CM | POA: Diagnosis not present

## 2016-07-06 DIAGNOSIS — N186 End stage renal disease: Secondary | ICD-10-CM | POA: Diagnosis not present

## 2016-07-07 DIAGNOSIS — N186 End stage renal disease: Secondary | ICD-10-CM | POA: Diagnosis not present

## 2016-07-08 DIAGNOSIS — N186 End stage renal disease: Secondary | ICD-10-CM | POA: Diagnosis not present

## 2016-07-09 DIAGNOSIS — N186 End stage renal disease: Secondary | ICD-10-CM | POA: Diagnosis not present

## 2016-07-10 DIAGNOSIS — N186 End stage renal disease: Secondary | ICD-10-CM | POA: Diagnosis not present

## 2016-07-11 DIAGNOSIS — N186 End stage renal disease: Secondary | ICD-10-CM | POA: Diagnosis not present

## 2016-07-12 DIAGNOSIS — N186 End stage renal disease: Secondary | ICD-10-CM | POA: Diagnosis not present

## 2016-07-13 DIAGNOSIS — N186 End stage renal disease: Secondary | ICD-10-CM | POA: Diagnosis not present

## 2016-07-14 DIAGNOSIS — N186 End stage renal disease: Secondary | ICD-10-CM | POA: Diagnosis not present

## 2016-07-15 DIAGNOSIS — N186 End stage renal disease: Secondary | ICD-10-CM | POA: Diagnosis not present

## 2016-07-16 DIAGNOSIS — N186 End stage renal disease: Secondary | ICD-10-CM | POA: Diagnosis not present

## 2016-07-17 DIAGNOSIS — N186 End stage renal disease: Secondary | ICD-10-CM | POA: Diagnosis not present

## 2016-07-18 DIAGNOSIS — N186 End stage renal disease: Secondary | ICD-10-CM | POA: Diagnosis not present

## 2016-07-19 DIAGNOSIS — N186 End stage renal disease: Secondary | ICD-10-CM | POA: Diagnosis not present

## 2016-07-20 DIAGNOSIS — N186 End stage renal disease: Secondary | ICD-10-CM | POA: Diagnosis not present

## 2016-07-20 DIAGNOSIS — Z992 Dependence on renal dialysis: Secondary | ICD-10-CM | POA: Diagnosis not present

## 2016-07-20 DIAGNOSIS — I129 Hypertensive chronic kidney disease with stage 1 through stage 4 chronic kidney disease, or unspecified chronic kidney disease: Secondary | ICD-10-CM | POA: Diagnosis not present

## 2016-07-21 DIAGNOSIS — N186 End stage renal disease: Secondary | ICD-10-CM | POA: Diagnosis not present

## 2016-07-21 DIAGNOSIS — Z7901 Long term (current) use of anticoagulants: Secondary | ICD-10-CM | POA: Diagnosis not present

## 2016-07-22 DIAGNOSIS — N186 End stage renal disease: Secondary | ICD-10-CM | POA: Diagnosis not present

## 2016-07-23 DIAGNOSIS — N186 End stage renal disease: Secondary | ICD-10-CM | POA: Diagnosis not present

## 2016-07-24 DIAGNOSIS — N186 End stage renal disease: Secondary | ICD-10-CM | POA: Diagnosis not present

## 2016-07-25 DIAGNOSIS — N186 End stage renal disease: Secondary | ICD-10-CM | POA: Diagnosis not present

## 2016-07-26 DIAGNOSIS — N186 End stage renal disease: Secondary | ICD-10-CM | POA: Diagnosis not present

## 2016-07-27 DIAGNOSIS — N186 End stage renal disease: Secondary | ICD-10-CM | POA: Diagnosis not present

## 2016-07-28 DIAGNOSIS — N186 End stage renal disease: Secondary | ICD-10-CM | POA: Diagnosis not present

## 2016-07-29 DIAGNOSIS — N186 End stage renal disease: Secondary | ICD-10-CM | POA: Diagnosis not present

## 2016-07-30 DIAGNOSIS — N186 End stage renal disease: Secondary | ICD-10-CM | POA: Diagnosis not present

## 2016-07-31 DIAGNOSIS — N186 End stage renal disease: Secondary | ICD-10-CM | POA: Diagnosis not present

## 2016-08-01 DIAGNOSIS — N186 End stage renal disease: Secondary | ICD-10-CM | POA: Diagnosis not present

## 2016-08-02 DIAGNOSIS — N186 End stage renal disease: Secondary | ICD-10-CM | POA: Diagnosis not present

## 2016-08-03 DIAGNOSIS — N186 End stage renal disease: Secondary | ICD-10-CM | POA: Diagnosis not present

## 2016-08-04 DIAGNOSIS — N186 End stage renal disease: Secondary | ICD-10-CM | POA: Diagnosis not present

## 2016-08-05 DIAGNOSIS — N186 End stage renal disease: Secondary | ICD-10-CM | POA: Diagnosis not present

## 2016-08-06 DIAGNOSIS — N186 End stage renal disease: Secondary | ICD-10-CM | POA: Diagnosis not present

## 2016-08-07 DIAGNOSIS — N186 End stage renal disease: Secondary | ICD-10-CM | POA: Diagnosis not present

## 2016-08-08 DIAGNOSIS — N186 End stage renal disease: Secondary | ICD-10-CM | POA: Diagnosis not present

## 2016-08-09 DIAGNOSIS — N186 End stage renal disease: Secondary | ICD-10-CM | POA: Diagnosis not present

## 2016-08-10 DIAGNOSIS — N186 End stage renal disease: Secondary | ICD-10-CM | POA: Diagnosis not present

## 2016-08-11 DIAGNOSIS — N186 End stage renal disease: Secondary | ICD-10-CM | POA: Diagnosis not present

## 2016-08-13 DIAGNOSIS — N186 End stage renal disease: Secondary | ICD-10-CM | POA: Diagnosis not present

## 2016-08-14 DIAGNOSIS — N186 End stage renal disease: Secondary | ICD-10-CM | POA: Diagnosis not present

## 2016-08-15 DIAGNOSIS — N186 End stage renal disease: Secondary | ICD-10-CM | POA: Diagnosis not present

## 2016-08-16 DIAGNOSIS — N186 End stage renal disease: Secondary | ICD-10-CM | POA: Diagnosis not present

## 2016-08-17 DIAGNOSIS — N186 End stage renal disease: Secondary | ICD-10-CM | POA: Diagnosis not present

## 2016-08-18 DIAGNOSIS — N186 End stage renal disease: Secondary | ICD-10-CM | POA: Diagnosis not present

## 2016-08-19 DIAGNOSIS — N186 End stage renal disease: Secondary | ICD-10-CM | POA: Diagnosis not present

## 2016-08-19 DIAGNOSIS — Z992 Dependence on renal dialysis: Secondary | ICD-10-CM | POA: Diagnosis not present

## 2016-08-19 DIAGNOSIS — I129 Hypertensive chronic kidney disease with stage 1 through stage 4 chronic kidney disease, or unspecified chronic kidney disease: Secondary | ICD-10-CM | POA: Diagnosis not present

## 2016-08-20 DIAGNOSIS — N186 End stage renal disease: Secondary | ICD-10-CM | POA: Diagnosis not present

## 2016-08-21 DIAGNOSIS — N186 End stage renal disease: Secondary | ICD-10-CM | POA: Diagnosis not present

## 2016-08-22 DIAGNOSIS — N186 End stage renal disease: Secondary | ICD-10-CM | POA: Diagnosis not present

## 2016-08-23 DIAGNOSIS — N186 End stage renal disease: Secondary | ICD-10-CM | POA: Diagnosis not present

## 2016-08-24 DIAGNOSIS — N186 End stage renal disease: Secondary | ICD-10-CM | POA: Diagnosis not present

## 2016-08-25 DIAGNOSIS — N186 End stage renal disease: Secondary | ICD-10-CM | POA: Diagnosis not present

## 2016-08-26 DIAGNOSIS — G4730 Sleep apnea, unspecified: Secondary | ICD-10-CM | POA: Diagnosis not present

## 2016-08-26 DIAGNOSIS — I4891 Unspecified atrial fibrillation: Secondary | ICD-10-CM | POA: Diagnosis not present

## 2016-08-26 DIAGNOSIS — I2510 Atherosclerotic heart disease of native coronary artery without angina pectoris: Secondary | ICD-10-CM | POA: Diagnosis not present

## 2016-08-26 DIAGNOSIS — E039 Hypothyroidism, unspecified: Secondary | ICD-10-CM | POA: Diagnosis not present

## 2016-08-26 DIAGNOSIS — Z0000 Encounter for general adult medical examination without abnormal findings: Secondary | ICD-10-CM | POA: Diagnosis not present

## 2016-08-26 DIAGNOSIS — E213 Hyperparathyroidism, unspecified: Secondary | ICD-10-CM | POA: Diagnosis not present

## 2016-08-26 DIAGNOSIS — N400 Enlarged prostate without lower urinary tract symptoms: Secondary | ICD-10-CM | POA: Diagnosis not present

## 2016-08-26 DIAGNOSIS — Z79899 Other long term (current) drug therapy: Secondary | ICD-10-CM | POA: Diagnosis not present

## 2016-08-26 DIAGNOSIS — Z86010 Personal history of colonic polyps: Secondary | ICD-10-CM | POA: Diagnosis not present

## 2016-08-26 DIAGNOSIS — E559 Vitamin D deficiency, unspecified: Secondary | ICD-10-CM | POA: Diagnosis not present

## 2016-08-26 DIAGNOSIS — M19031 Primary osteoarthritis, right wrist: Secondary | ICD-10-CM | POA: Diagnosis not present

## 2016-08-26 DIAGNOSIS — G4700 Insomnia, unspecified: Secondary | ICD-10-CM | POA: Diagnosis not present

## 2016-08-26 DIAGNOSIS — E782 Mixed hyperlipidemia: Secondary | ICD-10-CM | POA: Diagnosis not present

## 2016-08-26 DIAGNOSIS — N186 End stage renal disease: Secondary | ICD-10-CM | POA: Diagnosis not present

## 2016-08-26 DIAGNOSIS — Z1389 Encounter for screening for other disorder: Secondary | ICD-10-CM | POA: Diagnosis not present

## 2016-08-26 DIAGNOSIS — I10 Essential (primary) hypertension: Secondary | ICD-10-CM | POA: Diagnosis not present

## 2016-08-27 DIAGNOSIS — N186 End stage renal disease: Secondary | ICD-10-CM | POA: Diagnosis not present

## 2016-08-28 DIAGNOSIS — N186 End stage renal disease: Secondary | ICD-10-CM | POA: Diagnosis not present

## 2016-08-29 DIAGNOSIS — N186 End stage renal disease: Secondary | ICD-10-CM | POA: Diagnosis not present

## 2016-08-30 DIAGNOSIS — N186 End stage renal disease: Secondary | ICD-10-CM | POA: Diagnosis not present

## 2016-08-31 DIAGNOSIS — N186 End stage renal disease: Secondary | ICD-10-CM | POA: Diagnosis not present

## 2016-09-01 DIAGNOSIS — N186 End stage renal disease: Secondary | ICD-10-CM | POA: Diagnosis not present

## 2016-09-02 DIAGNOSIS — N186 End stage renal disease: Secondary | ICD-10-CM | POA: Diagnosis not present

## 2016-09-03 DIAGNOSIS — N186 End stage renal disease: Secondary | ICD-10-CM | POA: Diagnosis not present

## 2016-09-04 DIAGNOSIS — N186 End stage renal disease: Secondary | ICD-10-CM | POA: Diagnosis not present

## 2016-09-05 DIAGNOSIS — N186 End stage renal disease: Secondary | ICD-10-CM | POA: Diagnosis not present

## 2016-09-06 DIAGNOSIS — N186 End stage renal disease: Secondary | ICD-10-CM | POA: Diagnosis not present

## 2016-09-07 DIAGNOSIS — N186 End stage renal disease: Secondary | ICD-10-CM | POA: Diagnosis not present

## 2016-09-08 DIAGNOSIS — N186 End stage renal disease: Secondary | ICD-10-CM | POA: Diagnosis not present

## 2016-09-09 DIAGNOSIS — N186 End stage renal disease: Secondary | ICD-10-CM | POA: Diagnosis not present

## 2016-09-10 ENCOUNTER — Other Ambulatory Visit (HOSPITAL_COMMUNITY): Payer: Self-pay | Admitting: Psychiatry

## 2016-09-10 DIAGNOSIS — N186 End stage renal disease: Secondary | ICD-10-CM | POA: Diagnosis not present

## 2016-09-11 DIAGNOSIS — N186 End stage renal disease: Secondary | ICD-10-CM | POA: Diagnosis not present

## 2016-09-12 DIAGNOSIS — N186 End stage renal disease: Secondary | ICD-10-CM | POA: Diagnosis not present

## 2016-09-14 DIAGNOSIS — N186 End stage renal disease: Secondary | ICD-10-CM | POA: Diagnosis not present

## 2016-09-15 DIAGNOSIS — N186 End stage renal disease: Secondary | ICD-10-CM | POA: Diagnosis not present

## 2016-09-16 DIAGNOSIS — N186 End stage renal disease: Secondary | ICD-10-CM | POA: Diagnosis not present

## 2016-09-17 DIAGNOSIS — N186 End stage renal disease: Secondary | ICD-10-CM | POA: Diagnosis not present

## 2016-09-18 DIAGNOSIS — N186 End stage renal disease: Secondary | ICD-10-CM | POA: Diagnosis not present

## 2016-09-19 DIAGNOSIS — N186 End stage renal disease: Secondary | ICD-10-CM | POA: Diagnosis not present

## 2016-09-21 DIAGNOSIS — N2581 Secondary hyperparathyroidism of renal origin: Secondary | ICD-10-CM | POA: Diagnosis not present

## 2016-09-21 DIAGNOSIS — N186 End stage renal disease: Secondary | ICD-10-CM | POA: Diagnosis not present

## 2016-09-22 DIAGNOSIS — N2581 Secondary hyperparathyroidism of renal origin: Secondary | ICD-10-CM | POA: Diagnosis not present

## 2016-09-22 DIAGNOSIS — N186 End stage renal disease: Secondary | ICD-10-CM | POA: Diagnosis not present

## 2016-09-23 DIAGNOSIS — N186 End stage renal disease: Secondary | ICD-10-CM | POA: Diagnosis not present

## 2016-09-23 DIAGNOSIS — N2581 Secondary hyperparathyroidism of renal origin: Secondary | ICD-10-CM | POA: Diagnosis not present

## 2016-09-24 DIAGNOSIS — N2581 Secondary hyperparathyroidism of renal origin: Secondary | ICD-10-CM | POA: Diagnosis not present

## 2016-09-24 DIAGNOSIS — N186 End stage renal disease: Secondary | ICD-10-CM | POA: Diagnosis not present

## 2016-09-25 DIAGNOSIS — N2581 Secondary hyperparathyroidism of renal origin: Secondary | ICD-10-CM | POA: Diagnosis not present

## 2016-09-25 DIAGNOSIS — N186 End stage renal disease: Secondary | ICD-10-CM | POA: Diagnosis not present

## 2016-09-26 DIAGNOSIS — N2581 Secondary hyperparathyroidism of renal origin: Secondary | ICD-10-CM | POA: Diagnosis not present

## 2016-09-26 DIAGNOSIS — N186 End stage renal disease: Secondary | ICD-10-CM | POA: Diagnosis not present

## 2016-09-27 DIAGNOSIS — N2581 Secondary hyperparathyroidism of renal origin: Secondary | ICD-10-CM | POA: Diagnosis not present

## 2016-09-27 DIAGNOSIS — N186 End stage renal disease: Secondary | ICD-10-CM | POA: Diagnosis not present

## 2016-09-28 DIAGNOSIS — N2581 Secondary hyperparathyroidism of renal origin: Secondary | ICD-10-CM | POA: Diagnosis not present

## 2016-09-28 DIAGNOSIS — N186 End stage renal disease: Secondary | ICD-10-CM | POA: Diagnosis not present

## 2016-09-29 DIAGNOSIS — N2581 Secondary hyperparathyroidism of renal origin: Secondary | ICD-10-CM | POA: Diagnosis not present

## 2016-09-29 DIAGNOSIS — N186 End stage renal disease: Secondary | ICD-10-CM | POA: Diagnosis not present

## 2016-09-30 DIAGNOSIS — N186 End stage renal disease: Secondary | ICD-10-CM | POA: Diagnosis not present

## 2016-09-30 DIAGNOSIS — N2581 Secondary hyperparathyroidism of renal origin: Secondary | ICD-10-CM | POA: Diagnosis not present

## 2016-10-01 DIAGNOSIS — N2581 Secondary hyperparathyroidism of renal origin: Secondary | ICD-10-CM | POA: Diagnosis not present

## 2016-10-01 DIAGNOSIS — N186 End stage renal disease: Secondary | ICD-10-CM | POA: Diagnosis not present

## 2016-10-02 DIAGNOSIS — N186 End stage renal disease: Secondary | ICD-10-CM | POA: Diagnosis not present

## 2016-10-02 DIAGNOSIS — N2581 Secondary hyperparathyroidism of renal origin: Secondary | ICD-10-CM | POA: Diagnosis not present

## 2016-10-03 DIAGNOSIS — N186 End stage renal disease: Secondary | ICD-10-CM | POA: Diagnosis not present

## 2016-10-03 DIAGNOSIS — N2581 Secondary hyperparathyroidism of renal origin: Secondary | ICD-10-CM | POA: Diagnosis not present

## 2016-10-04 DIAGNOSIS — N186 End stage renal disease: Secondary | ICD-10-CM | POA: Diagnosis not present

## 2016-10-04 DIAGNOSIS — N2581 Secondary hyperparathyroidism of renal origin: Secondary | ICD-10-CM | POA: Diagnosis not present

## 2016-10-05 DIAGNOSIS — N2581 Secondary hyperparathyroidism of renal origin: Secondary | ICD-10-CM | POA: Diagnosis not present

## 2016-10-05 DIAGNOSIS — N186 End stage renal disease: Secondary | ICD-10-CM | POA: Diagnosis not present

## 2016-10-06 DIAGNOSIS — N2581 Secondary hyperparathyroidism of renal origin: Secondary | ICD-10-CM | POA: Diagnosis not present

## 2016-10-06 DIAGNOSIS — N186 End stage renal disease: Secondary | ICD-10-CM | POA: Diagnosis not present

## 2016-10-07 DIAGNOSIS — N186 End stage renal disease: Secondary | ICD-10-CM | POA: Diagnosis not present

## 2016-10-07 DIAGNOSIS — N2581 Secondary hyperparathyroidism of renal origin: Secondary | ICD-10-CM | POA: Diagnosis not present

## 2016-10-08 DIAGNOSIS — N186 End stage renal disease: Secondary | ICD-10-CM | POA: Diagnosis not present

## 2016-10-08 DIAGNOSIS — N2581 Secondary hyperparathyroidism of renal origin: Secondary | ICD-10-CM | POA: Diagnosis not present

## 2016-10-09 DIAGNOSIS — N186 End stage renal disease: Secondary | ICD-10-CM | POA: Diagnosis not present

## 2016-10-09 DIAGNOSIS — N2581 Secondary hyperparathyroidism of renal origin: Secondary | ICD-10-CM | POA: Diagnosis not present

## 2016-10-09 DIAGNOSIS — R1013 Epigastric pain: Secondary | ICD-10-CM | POA: Diagnosis not present

## 2016-10-10 DIAGNOSIS — N186 End stage renal disease: Secondary | ICD-10-CM | POA: Diagnosis not present

## 2016-10-10 DIAGNOSIS — N2581 Secondary hyperparathyroidism of renal origin: Secondary | ICD-10-CM | POA: Diagnosis not present

## 2016-10-11 DIAGNOSIS — N2581 Secondary hyperparathyroidism of renal origin: Secondary | ICD-10-CM | POA: Diagnosis not present

## 2016-10-11 DIAGNOSIS — N186 End stage renal disease: Secondary | ICD-10-CM | POA: Diagnosis not present

## 2016-10-12 DIAGNOSIS — N186 End stage renal disease: Secondary | ICD-10-CM | POA: Diagnosis not present

## 2016-10-12 DIAGNOSIS — N2581 Secondary hyperparathyroidism of renal origin: Secondary | ICD-10-CM | POA: Diagnosis not present

## 2016-10-13 DIAGNOSIS — N186 End stage renal disease: Secondary | ICD-10-CM | POA: Diagnosis not present

## 2016-10-13 DIAGNOSIS — N2581 Secondary hyperparathyroidism of renal origin: Secondary | ICD-10-CM | POA: Diagnosis not present

## 2016-10-14 DIAGNOSIS — N2581 Secondary hyperparathyroidism of renal origin: Secondary | ICD-10-CM | POA: Diagnosis not present

## 2016-10-14 DIAGNOSIS — N186 End stage renal disease: Secondary | ICD-10-CM | POA: Diagnosis not present

## 2016-10-15 DIAGNOSIS — N2581 Secondary hyperparathyroidism of renal origin: Secondary | ICD-10-CM | POA: Diagnosis not present

## 2016-10-15 DIAGNOSIS — N186 End stage renal disease: Secondary | ICD-10-CM | POA: Diagnosis not present

## 2016-10-16 DIAGNOSIS — N2581 Secondary hyperparathyroidism of renal origin: Secondary | ICD-10-CM | POA: Diagnosis not present

## 2016-10-16 DIAGNOSIS — N186 End stage renal disease: Secondary | ICD-10-CM | POA: Diagnosis not present

## 2016-10-17 DIAGNOSIS — N186 End stage renal disease: Secondary | ICD-10-CM | POA: Diagnosis not present

## 2016-10-17 DIAGNOSIS — N2581 Secondary hyperparathyroidism of renal origin: Secondary | ICD-10-CM | POA: Diagnosis not present

## 2016-10-18 DIAGNOSIS — N2581 Secondary hyperparathyroidism of renal origin: Secondary | ICD-10-CM | POA: Diagnosis not present

## 2016-10-18 DIAGNOSIS — N186 End stage renal disease: Secondary | ICD-10-CM | POA: Diagnosis not present

## 2016-10-19 ENCOUNTER — Emergency Department (HOSPITAL_COMMUNITY)
Admission: EM | Admit: 2016-10-19 | Discharge: 2016-10-19 | Disposition: A | Discharge status: Home / Self Care | Payer: Medicare HMO | Attending: Emergency Medicine | Admitting: Emergency Medicine

## 2016-10-19 ENCOUNTER — Emergency Department (HOSPITAL_COMMUNITY): Payer: Medicare HMO

## 2016-10-19 DIAGNOSIS — R059 Cough, unspecified: Secondary | ICD-10-CM

## 2016-10-19 DIAGNOSIS — I2510 Atherosclerotic heart disease of native coronary artery without angina pectoris: Secondary | ICD-10-CM | POA: Diagnosis not present

## 2016-10-19 DIAGNOSIS — R0982 Postnasal drip: Secondary | ICD-10-CM | POA: Diagnosis not present

## 2016-10-19 DIAGNOSIS — K219 Gastro-esophageal reflux disease without esophagitis: Secondary | ICD-10-CM | POA: Diagnosis not present

## 2016-10-19 DIAGNOSIS — R05 Cough: Secondary | ICD-10-CM

## 2016-10-19 DIAGNOSIS — R0602 Shortness of breath: Secondary | ICD-10-CM | POA: Diagnosis not present

## 2016-10-19 DIAGNOSIS — R0981 Nasal congestion: Secondary | ICD-10-CM | POA: Diagnosis not present

## 2016-10-19 DIAGNOSIS — N186 End stage renal disease: Secondary | ICD-10-CM | POA: Diagnosis not present

## 2016-10-19 DIAGNOSIS — F17290 Nicotine dependence, other tobacco product, uncomplicated: Secondary | ICD-10-CM | POA: Insufficient documentation

## 2016-10-19 DIAGNOSIS — Z7901 Long term (current) use of anticoagulants: Secondary | ICD-10-CM | POA: Diagnosis not present

## 2016-10-19 DIAGNOSIS — Z79899 Other long term (current) drug therapy: Secondary | ICD-10-CM | POA: Insufficient documentation

## 2016-10-19 DIAGNOSIS — N183 Chronic kidney disease, stage 3 (moderate): Secondary | ICD-10-CM | POA: Insufficient documentation

## 2016-10-19 DIAGNOSIS — E039 Hypothyroidism, unspecified: Secondary | ICD-10-CM | POA: Insufficient documentation

## 2016-10-19 DIAGNOSIS — N2581 Secondary hyperparathyroidism of renal origin: Secondary | ICD-10-CM | POA: Diagnosis not present

## 2016-10-19 DIAGNOSIS — I129 Hypertensive chronic kidney disease with stage 1 through stage 4 chronic kidney disease, or unspecified chronic kidney disease: Secondary | ICD-10-CM | POA: Insufficient documentation

## 2016-10-19 LAB — BASIC METABOLIC PANEL
ANION GAP: 10 (ref 5–15)
BUN: 26 mg/dL — AB (ref 6–20)
CALCIUM: 8.6 mg/dL — AB (ref 8.9–10.3)
CO2: 23 mmol/L (ref 22–32)
CREATININE: 4.33 mg/dL — AB (ref 0.61–1.24)
Chloride: 109 mmol/L (ref 101–111)
GFR calc Af Amer: 13 mL/min — ABNORMAL LOW (ref >60)
GFR, EST NON AFRICAN AMERICAN: 11 mL/min — AB (ref >60)
GLUCOSE: 76 mg/dL (ref 65–99)
Potassium: 4 mmol/L (ref 3.5–5.1)
Sodium: 142 mmol/L (ref 135–145)

## 2016-10-19 LAB — CBC
HCT: 34.3 % — ABNORMAL LOW (ref 39.0–52.0)
Hemoglobin: 11.1 g/dL — ABNORMAL LOW (ref 13.0–17.0)
MCH: 31.9 pg (ref 26.0–34.0)
MCHC: 32.4 g/dL (ref 30.0–36.0)
MCV: 98.6 fL (ref 78.0–100.0)
Platelets: 307 10*3/uL (ref 150–400)
RBC: 3.48 MIL/uL — ABNORMAL LOW (ref 4.22–5.81)
RDW: 15 % (ref 11.5–15.5)
WBC: 11.7 10*3/uL — ABNORMAL HIGH (ref 4.0–10.5)

## 2016-10-19 LAB — URINALYSIS, ROUTINE W REFLEX MICROSCOPIC
BILIRUBIN URINE: NEGATIVE
Bacteria, UA: NONE SEEN
Glucose, UA: NEGATIVE mg/dL
Hgb urine dipstick: NEGATIVE
Ketones, ur: NEGATIVE mg/dL
Leukocytes, UA: NEGATIVE
NITRITE: NEGATIVE
Protein, ur: 30 mg/dL — AB
SPECIFIC GRAVITY, URINE: 1.011 (ref 1.005–1.030)
pH: 8 (ref 5.0–8.0)

## 2016-10-19 LAB — CBG MONITORING, ED: Glucose-Capillary: 100 mg/dL — ABNORMAL HIGH (ref 65–99)

## 2016-10-19 LAB — PROTIME-INR
INR: 1.16
PROTHROMBIN TIME: 14.8 s (ref 11.4–15.2)

## 2016-10-19 NOTE — ED Notes (Signed)
EDP at bedside  

## 2016-10-19 NOTE — Discharge Instructions (Signed)
Please initiate an over the counter decongestant for your nasal congestion and post-nasal drip as well as increase your anti-acid to twice a day

## 2016-10-19 NOTE — ED Triage Notes (Addendum)
Went to dr today and was told to come to er for fluid in his lungs , pt is PD  But is moving to hemodiaylsis, pt states feels weak

## 2016-10-19 NOTE — ED Provider Notes (Signed)
[facility] DEPT Provider Note  CSN: [id number] Arrival date & time: 10/19/16  1403  History   Chief Complaint Chief Complaint  Patient presents with  . Shortness of Breath  . Cough   HPI Tim Ponce is a 83 y.o. male.  The history is provided by the patient and medical records. No language interpreter was used.  Illness  This is a new problem. The current episode started more than 1 week ago. The problem occurs constantly. The problem has not changed since onset.Pertinent negatives include no chest pain, no abdominal pain, no headaches and no shortness of breath. The symptoms are aggravated by eating and coughing. Nothing relieves the symptoms.    Past Medical History:  Diagnosis Date  . Anemia    "borderline" (07/27/2012)  . Anxiety   . Atrial fibrillation   . Atrial flutter    s/p ablation Sep 28, 2011  . Bipolar disorder   . CKD (chronic kidney disease) stage 3, GFR 30-59 ml/min    "almost CKD 4" (07/27/2012)  . Coronary artery disease    mild, 60% stenosis of prox LAD per CTA 2007; normal perfusion study 2010 ([facility])  . Depression   . DJD (degenerative joint disease)    "knees and lower back" (07/27/2012), wrist  . GERD (gastroesophageal reflux disease)   . H/O hiatal hernia   . Hemorrhoid   . History of blood transfusion    "related to left knee replacement" (07/27/2012)  . Hypercholesteremia   . Hyperparathyroidism   . Hypertension   . Hypothyroidism   . OSA on CPAP    compliant with CPAP  . Pericarditis    age 89  . PTSD (post-traumatic stress disorder)    Patient Active Problem List   Diagnosis Date Noted  . Obesity, unspecified 01/08/2014  . Coronary atherosclerosis of native coronary artery 01/08/2014  . 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 Atrial flutter ([place]) 09/21/2011  . atrial fibrillation 09/21/2011  . Hypertension 09/21/2011   Past Surgical History:  Procedure Laterality Date  . Atrial flutter ablation  09/28/2011  .  ATRIAL FLUTTER ABLATION N/A 09/28/2011   Procedure: ATRIAL FLUTTER ABLATION;  Surgeon: Thompson Grayer, MD;  Location: [facility] CATH LAB;  Service: Cardiovascular;  Laterality: N/A;  . AV FISTULA PLACEMENT Left 05/07/2015   Procedure: CREATION OF  BRACHIOCEPHALIC ARTERIOVENOUS (AV) FISTULA  LEFT ARM.;  Surgeon: Mal Misty, MD;  Location: [facility];  Service: Vascular;  Laterality: Left;  . CARDIAC CATHETERIZATION    . COLONOSCOPY    . EYE SURGERY Bilateral    Cataract  . KNEE ARTHROSCOPY  1990's   right  . LUMBAR LAMINECTOMY/DECMPRESSION MICRODISCECTOMY  07/27/2012  . LUMBAR LAMINECTOMY/DECOMPRESSION MICRODISCECTOMY  07/27/2012   Procedure: LUMBAR LAMINECTOMY/DECOMPRESSION MICRODISCECTOMY 1 LEVEL;  Surgeon: Melina Schools, MD;  Location: [place];  Service: Orthopedics;  Laterality: Right;  L4 - L5 Discectomy on the Right  . TOTAL KNEE ARTHROPLASTY Bilateral 2008; ~ 2011   right; left   Home Medications    Prior to Admission medications   Medication Sig Start Date End Date Taking? Authorizing Provider  amiodarone (PACERONE) 200 MG tablet Take 200 mg by mouth every morning.     Historical Provider, MD  amLODipine (NORVASC) 10 MG tablet 1 tablet daily Patient taking differently: Take 10 mg by mouth daily.  01/03/14   Jettie Booze, MD  amoxicillin (AMOXIL) 200 MG/5ML suspension Take 2,000 mg by mouth daily. For dental procedures    Historical Provider, MD  calcitRIOL (  ROCALTROL) 0.25 MCG capsule Take 0.25 mcg by mouth every evening.     Historical Provider, MD  carvedilol (COREG) 6.25 MG tablet 1 tablet twice a day Patient taking differently: Take 6.25 mg by mouth 2 (two) times daily with a meal.  01/03/14   Jettie Booze, MD  cholecalciferol (VITAMIN D) 1000 UNITS tablet Take 1,000 Units by mouth 2 (two) times daily. Vitamin d3    Historical Provider, MD  Cyanocobalamin (B-12 SUPER STRENGTH SL) Place 1 tablet under the tongue daily.    Historical Provider, MD  finasteride (PROSCAR) 5 MG tablet  Take 5 mg by mouth daily.    Historical Provider, MD  furosemide (LASIX) 40 MG tablet Take 40 mg by mouth See admin instructions. Take 40mg  by mouth on days Monday through Friday. (5 times weekly)    Historical Provider, MD  lamoTRIgine (LAMICTAL) 25 MG tablet Take 50 mg by mouth daily.     Historical Provider, MD  levothyroxine (SYNTHROID, LEVOTHROID) 112 MCG tablet Take 112 mcg by mouth daily before breakfast.     Historical Provider, MD  Magnesium Oxide 500 MG CAPS Take 500 mg by mouth every other day.     Historical Provider, MD  metroNIDAZOLE (METROGEL) 0.75 % gel Apply 1 application topically daily.    Historical Provider, MD  omeprazole (PRILOSEC) 20 MG capsule Take 20 mg by mouth every morning.     Historical Provider, MD  oxyCODONE-acetaminophen (ROXICET) 5-325 MG per tablet Take 1 tablet by mouth every 6 (six) hours as needed for severe pain. Patient not taking: Reported on 06/17/2015 05/07/15   Alvia Grove, PA-C  simvastatin (ZOCOR) 20 MG tablet Take 20 mg by mouth at bedtime.     Historical Provider, MD  tadalafil (CIALIS) 5 MG tablet Take 5 mg by mouth as needed for erectile dysfunction.    Historical Provider, MD  temazepam (RESTORIL) 15 MG capsule Take 15-30 mg by mouth at bedtime as needed for sleep.     Historical Provider, MD  venlafaxine XR (EFFEXOR-XR) 150 MG 24 hr capsule Take 150 mg by mouth daily with breakfast.    Historical Provider, MD  warfarin (COUMADIN) 5 MG tablet Take 2.5-5 mg by mouth See admin instructions. Take 5 mg by mouth on Sunday, Tuesday, Thursday and Saturday. Take 2.5mg  by mouth on Monday, Wednesday and Friday.    Historical Provider, MD   Family History Family History  Problem Relation Age of Onset  . Uterine cancer Mother   . COPD Father    Social History Social History  Substance Use Topics  . Smoking status: Current Some Day Smoker    Years: 20.00    Types: Cigars  . Smokeless tobacco: Never Used     Comment: quit smoking cigarettes age 77.   Smoke 2 cigars a week  . Alcohol use 0.0 oz/week     Comment: 4 onces of wine -5 times a weeks   Allergies   Patient has no known allergies.  Review of Systems Review of Systems  Constitutional: Positive for fatigue. Negative for appetite change, chills, diaphoresis and fever.  HENT: Positive for congestion and postnasal drip.   Respiratory: Positive for cough. Negative for shortness of breath.   Cardiovascular: Negative for chest pain.  Gastrointestinal: Negative for abdominal pain.  Neurological: Negative for headaches.  All other systems reviewed and are negative.  Physical Exam Updated Vital Signs BP 136/75 (BP Location: Right Arm)   Pulse 86   Temp 98 F (36.7 C) (Oral)  Resp (!) 28   SpO2 97%   Physical Exam  Constitutional: He is oriented to person, place, and time. No distress.  Elderly overweight Caucasian male  HENT:  Head: Normocephalic and atraumatic.  Eyes: EOM are normal. Pupils are equal, round, and reactive to light.  Neck: Normal range of motion. Neck supple.  Cardiovascular: Normal rate, regular rhythm and normal heart sounds.   Pulmonary/Chest: Effort normal and breath sounds normal.  Abdominal: Soft. Bowel sounds are normal. He exhibits distension. There is no tenderness.  Peritoneal catheter in place without any evidence of surrounding erythema or induration or premature drainage  Musculoskeletal: Normal range of motion.  AVF in place to LUE with palpable thrill  Neurological: He is alert and oriented to person, place, and time.  Skin: Skin is warm and dry. Capillary refill takes less than 2 seconds. He is not diaphoretic.  Nursing note and vitals reviewed.  ED Treatments / Results  Labs (all labs ordered are listed, but only abnormal results are displayed) Labs Reviewed  BASIC METABOLIC PANEL - Abnormal; Notable for the following:       Result Value   BUN 26 (*)    Creatinine, Ser 4.33 (*)    Calcium 8.6 (*)    GFR calc non Af Amer 11 (*)      GFR calc Af Amer 13 (*)    All other components within normal limits  CBC - Abnormal; Notable for the following:    WBC 11.7 (*)    RBC 3.48 (*)    Hemoglobin 11.1 (*)    HCT 34.3 (*)    All other components within normal limits  URINALYSIS, ROUTINE W REFLEX MICROSCOPIC - Abnormal; Notable for the following:    APPearance HAZY (*)    Protein, ur 30 (*)    Squamous Epithelial / LPF 0-5 (*)    All other components within normal limits  CBG MONITORING, ED - Abnormal; Notable for the following:    Glucose-Capillary 100 (*)    All other components within normal limits  PROTIME-INR   EKG  EKG Interpretation  Date/Time:  Tuesday October 19 2016 14:28:08 EST Ventricular Rate:  103 PR Interval:  136 QRS Duration: 150 QT Interval:  440 QTC Calculation: 576 R Axis:   -53 Text Interpretation:  Sinus tachycardia Right bundle branch block Left anterior fascicular block Bifascicular block Left ventricular hypertrophy Abnormal ECG Confirmed by HAVILAND MD, JULIE ([id number]) on 10/19/2016 3:57:39 PM      Radiology Dg Chest 2 View  Result Date: 10/19/2016 CLINICAL DATA:  Weakness and shortness of Breath EXAM: CHEST  2 VIEW COMPARISON:  10/04/2014 FINDINGS: Cardiac shadow is stable. Calcified granuloma is noted in the left lung base. The lungs are clear. No sizable effusion or CHF is noted. No bony abnormality is seen IMPRESSION: No acute abnormality noted. Electronically Signed   By: Inez Catalina M.D.   On: 10/19/2016 15:13   Procedures Procedures (including critical care time)  Medications Ordered in ED Medications - No data to display  Initial Impression / Assessment and Plan / ED Course  I have reviewed the triage vital signs and the nursing notes.  83 y.o. male with above stated PMHx, HPI, and physical. PMHx of obesity, HTN, HLD, CAD, A-flutter s/p ablation, ESRD ([place]), OSA on CPAP, depression, bipolar, & PTSD. Symptoms onset of her past month and unchanged. Postprandial burning  sensation associated with her taste in back of mouth, nasal congestion, postnasal drip, cough. Patient denies fevers, rigors, nausea,  vomiting, diarrhea, abdominal pain, abdominal distention, shortness of breath, leg swelling. Patient has been eating and drinking well. Patient still has normal amount of urine output.  EKG with old RBBB but no ST elevation/depression or new T wave inversions. Patient without fevers at home. Afebrile here. WBC 11.7. Patient denying abdominal pain. Abdominal exam benign. No concern for SBP. Patient does not appear to have signs of fluid overload at home and chest x-ray without pulmonary edema or pleural effusions as well as no evidence of pneumonia. UA (-) for infection. Cr slightly above baseline but electrolytes wnl and patient still making normal amount of UOP. Pt well appearing on exam.  Laboratory and imaging results were personally reviewed by myself and used in the medical decision making of this patient's treatment and disposition.  Believe patient's persistent cough is secondary to postnasal drip and/or reflux. Patient ready taking PPI at home 20 mg once daily and will increase his regimen. Patient will also initiate over-the-counter decongestant. Patient has fall with nephrology early February.  Pt discharged home in stable condition. Strict ED return precautions dicussed. Pt understands and agrees with the plan and has no further questions or concerns.   Pt care discussed with and followed by my attending, Dr. Vidal Schwalbe, MD Pager [phone]  Final Clinical Impressions(s) / ED Diagnoses   Final diagnoses:  Cough  Gastroesophageal reflux disease  Nasal congestion  Post-nasal drainage   New Prescriptions New Prescriptions   No medications on file     Mayer Camel, MD 10/19/16 1709    Isla Pence, MD 10/19/16 1712

## 2016-10-20 DIAGNOSIS — N2581 Secondary hyperparathyroidism of renal origin: Secondary | ICD-10-CM | POA: Diagnosis not present

## 2016-10-20 DIAGNOSIS — N186 End stage renal disease: Secondary | ICD-10-CM | POA: Diagnosis not present

## 2016-10-21 DIAGNOSIS — N2581 Secondary hyperparathyroidism of renal origin: Secondary | ICD-10-CM | POA: Diagnosis not present

## 2016-10-21 DIAGNOSIS — Z7901 Long term (current) use of anticoagulants: Secondary | ICD-10-CM | POA: Diagnosis not present

## 2016-10-21 DIAGNOSIS — D631 Anemia in chronic kidney disease: Secondary | ICD-10-CM | POA: Diagnosis not present

## 2016-10-21 DIAGNOSIS — J40 Bronchitis, not specified as acute or chronic: Secondary | ICD-10-CM | POA: Diagnosis not present

## 2016-10-21 DIAGNOSIS — K2270 Barrett's esophagus without dysplasia: Secondary | ICD-10-CM | POA: Diagnosis not present

## 2016-10-21 DIAGNOSIS — G4730 Sleep apnea, unspecified: Secondary | ICD-10-CM | POA: Diagnosis not present

## 2016-10-21 DIAGNOSIS — K219 Gastro-esophageal reflux disease without esophagitis: Secondary | ICD-10-CM | POA: Diagnosis not present

## 2016-10-21 DIAGNOSIS — N186 End stage renal disease: Secondary | ICD-10-CM | POA: Diagnosis not present

## 2016-10-21 DIAGNOSIS — I4891 Unspecified atrial fibrillation: Secondary | ICD-10-CM | POA: Diagnosis not present

## 2016-10-22 DIAGNOSIS — N186 End stage renal disease: Secondary | ICD-10-CM | POA: Diagnosis not present

## 2016-10-22 DIAGNOSIS — N2581 Secondary hyperparathyroidism of renal origin: Secondary | ICD-10-CM | POA: Diagnosis not present

## 2016-10-23 DIAGNOSIS — N2581 Secondary hyperparathyroidism of renal origin: Secondary | ICD-10-CM | POA: Diagnosis not present

## 2016-10-23 DIAGNOSIS — N186 End stage renal disease: Secondary | ICD-10-CM | POA: Diagnosis not present

## 2016-10-24 DIAGNOSIS — N2581 Secondary hyperparathyroidism of renal origin: Secondary | ICD-10-CM | POA: Diagnosis not present

## 2016-10-24 DIAGNOSIS — N186 End stage renal disease: Secondary | ICD-10-CM | POA: Diagnosis not present

## 2016-10-25 DIAGNOSIS — N2581 Secondary hyperparathyroidism of renal origin: Secondary | ICD-10-CM | POA: Diagnosis not present

## 2016-10-25 DIAGNOSIS — N186 End stage renal disease: Secondary | ICD-10-CM | POA: Diagnosis not present

## 2016-10-26 DIAGNOSIS — N2581 Secondary hyperparathyroidism of renal origin: Secondary | ICD-10-CM | POA: Diagnosis not present

## 2016-10-26 DIAGNOSIS — N186 End stage renal disease: Secondary | ICD-10-CM | POA: Diagnosis not present

## 2016-10-27 DIAGNOSIS — N186 End stage renal disease: Secondary | ICD-10-CM | POA: Diagnosis not present

## 2016-10-27 DIAGNOSIS — N2581 Secondary hyperparathyroidism of renal origin: Secondary | ICD-10-CM | POA: Diagnosis not present

## 2016-10-28 DIAGNOSIS — N2581 Secondary hyperparathyroidism of renal origin: Secondary | ICD-10-CM | POA: Diagnosis not present

## 2016-10-28 DIAGNOSIS — N186 End stage renal disease: Secondary | ICD-10-CM | POA: Diagnosis not present

## 2016-10-29 DIAGNOSIS — N2581 Secondary hyperparathyroidism of renal origin: Secondary | ICD-10-CM | POA: Diagnosis not present

## 2016-10-29 DIAGNOSIS — N186 End stage renal disease: Secondary | ICD-10-CM | POA: Diagnosis not present

## 2016-10-30 DIAGNOSIS — N186 End stage renal disease: Secondary | ICD-10-CM | POA: Diagnosis not present

## 2016-10-30 DIAGNOSIS — N2581 Secondary hyperparathyroidism of renal origin: Secondary | ICD-10-CM | POA: Diagnosis not present

## 2016-10-31 DIAGNOSIS — N2581 Secondary hyperparathyroidism of renal origin: Secondary | ICD-10-CM | POA: Diagnosis not present

## 2016-10-31 DIAGNOSIS — N186 End stage renal disease: Secondary | ICD-10-CM | POA: Diagnosis not present

## 2016-11-02 DIAGNOSIS — N186 End stage renal disease: Secondary | ICD-10-CM | POA: Diagnosis not present

## 2016-11-02 DIAGNOSIS — D509 Iron deficiency anemia, unspecified: Secondary | ICD-10-CM | POA: Diagnosis not present

## 2016-11-02 DIAGNOSIS — I4891 Unspecified atrial fibrillation: Secondary | ICD-10-CM | POA: Diagnosis not present

## 2016-11-02 DIAGNOSIS — D631 Anemia in chronic kidney disease: Secondary | ICD-10-CM | POA: Diagnosis not present

## 2016-11-02 DIAGNOSIS — Z1159 Encounter for screening for other viral diseases: Secondary | ICD-10-CM | POA: Diagnosis not present

## 2016-11-04 DIAGNOSIS — D509 Iron deficiency anemia, unspecified: Secondary | ICD-10-CM | POA: Diagnosis not present

## 2016-11-04 DIAGNOSIS — N186 End stage renal disease: Secondary | ICD-10-CM | POA: Diagnosis not present

## 2016-11-04 DIAGNOSIS — D631 Anemia in chronic kidney disease: Secondary | ICD-10-CM | POA: Diagnosis not present

## 2016-11-06 DIAGNOSIS — D631 Anemia in chronic kidney disease: Secondary | ICD-10-CM | POA: Diagnosis not present

## 2016-11-06 DIAGNOSIS — D509 Iron deficiency anemia, unspecified: Secondary | ICD-10-CM | POA: Diagnosis not present

## 2016-11-06 DIAGNOSIS — N186 End stage renal disease: Secondary | ICD-10-CM | POA: Diagnosis not present

## 2016-11-09 DIAGNOSIS — I4891 Unspecified atrial fibrillation: Secondary | ICD-10-CM | POA: Diagnosis not present

## 2016-11-09 DIAGNOSIS — D509 Iron deficiency anemia, unspecified: Secondary | ICD-10-CM | POA: Diagnosis not present

## 2016-11-09 DIAGNOSIS — N186 End stage renal disease: Secondary | ICD-10-CM | POA: Diagnosis not present

## 2016-11-09 DIAGNOSIS — D631 Anemia in chronic kidney disease: Secondary | ICD-10-CM | POA: Diagnosis not present

## 2016-11-11 DIAGNOSIS — N2581 Secondary hyperparathyroidism of renal origin: Secondary | ICD-10-CM | POA: Diagnosis not present

## 2016-11-11 DIAGNOSIS — D509 Iron deficiency anemia, unspecified: Secondary | ICD-10-CM | POA: Diagnosis not present

## 2016-11-11 DIAGNOSIS — N186 End stage renal disease: Secondary | ICD-10-CM | POA: Diagnosis not present

## 2016-11-13 DIAGNOSIS — N186 End stage renal disease: Secondary | ICD-10-CM | POA: Diagnosis not present

## 2016-11-13 DIAGNOSIS — D509 Iron deficiency anemia, unspecified: Secondary | ICD-10-CM | POA: Diagnosis not present

## 2016-11-13 DIAGNOSIS — N2581 Secondary hyperparathyroidism of renal origin: Secondary | ICD-10-CM | POA: Diagnosis not present

## 2016-11-16 DIAGNOSIS — D509 Iron deficiency anemia, unspecified: Secondary | ICD-10-CM | POA: Diagnosis not present

## 2016-11-16 DIAGNOSIS — I4891 Unspecified atrial fibrillation: Secondary | ICD-10-CM | POA: Diagnosis not present

## 2016-11-16 DIAGNOSIS — N2581 Secondary hyperparathyroidism of renal origin: Secondary | ICD-10-CM | POA: Diagnosis not present

## 2016-11-16 DIAGNOSIS — N186 End stage renal disease: Secondary | ICD-10-CM | POA: Diagnosis not present

## 2016-11-17 DIAGNOSIS — Z992 Dependence on renal dialysis: Secondary | ICD-10-CM | POA: Diagnosis not present

## 2016-11-17 DIAGNOSIS — N186 End stage renal disease: Secondary | ICD-10-CM | POA: Diagnosis not present

## 2016-11-18 DIAGNOSIS — N186 End stage renal disease: Secondary | ICD-10-CM | POA: Diagnosis not present

## 2016-11-18 DIAGNOSIS — D631 Anemia in chronic kidney disease: Secondary | ICD-10-CM | POA: Diagnosis not present

## 2016-11-18 DIAGNOSIS — D509 Iron deficiency anemia, unspecified: Secondary | ICD-10-CM | POA: Diagnosis not present

## 2016-11-18 DIAGNOSIS — N2581 Secondary hyperparathyroidism of renal origin: Secondary | ICD-10-CM | POA: Diagnosis not present

## 2016-11-20 DIAGNOSIS — D631 Anemia in chronic kidney disease: Secondary | ICD-10-CM | POA: Diagnosis not present

## 2016-11-20 DIAGNOSIS — N186 End stage renal disease: Secondary | ICD-10-CM | POA: Diagnosis not present

## 2016-11-20 DIAGNOSIS — D509 Iron deficiency anemia, unspecified: Secondary | ICD-10-CM | POA: Diagnosis not present

## 2016-11-20 DIAGNOSIS — N2581 Secondary hyperparathyroidism of renal origin: Secondary | ICD-10-CM | POA: Diagnosis not present

## 2016-11-23 DIAGNOSIS — N186 End stage renal disease: Secondary | ICD-10-CM | POA: Diagnosis not present

## 2016-11-23 DIAGNOSIS — N2581 Secondary hyperparathyroidism of renal origin: Secondary | ICD-10-CM | POA: Diagnosis not present

## 2016-11-23 DIAGNOSIS — D631 Anemia in chronic kidney disease: Secondary | ICD-10-CM | POA: Diagnosis not present

## 2016-11-23 DIAGNOSIS — D509 Iron deficiency anemia, unspecified: Secondary | ICD-10-CM | POA: Diagnosis not present

## 2016-11-23 DIAGNOSIS — I4891 Unspecified atrial fibrillation: Secondary | ICD-10-CM | POA: Diagnosis not present

## 2016-11-25 DIAGNOSIS — D631 Anemia in chronic kidney disease: Secondary | ICD-10-CM | POA: Diagnosis not present

## 2016-11-25 DIAGNOSIS — N186 End stage renal disease: Secondary | ICD-10-CM | POA: Diagnosis not present

## 2016-11-25 DIAGNOSIS — D509 Iron deficiency anemia, unspecified: Secondary | ICD-10-CM | POA: Diagnosis not present

## 2016-11-25 DIAGNOSIS — N2581 Secondary hyperparathyroidism of renal origin: Secondary | ICD-10-CM | POA: Diagnosis not present

## 2016-11-27 DIAGNOSIS — N186 End stage renal disease: Secondary | ICD-10-CM | POA: Diagnosis not present

## 2016-11-27 DIAGNOSIS — D631 Anemia in chronic kidney disease: Secondary | ICD-10-CM | POA: Diagnosis not present

## 2016-11-27 DIAGNOSIS — D509 Iron deficiency anemia, unspecified: Secondary | ICD-10-CM | POA: Diagnosis not present

## 2016-11-27 DIAGNOSIS — N2581 Secondary hyperparathyroidism of renal origin: Secondary | ICD-10-CM | POA: Diagnosis not present

## 2016-11-30 DIAGNOSIS — I4891 Unspecified atrial fibrillation: Secondary | ICD-10-CM | POA: Diagnosis not present

## 2016-11-30 DIAGNOSIS — D631 Anemia in chronic kidney disease: Secondary | ICD-10-CM | POA: Diagnosis not present

## 2016-11-30 DIAGNOSIS — N186 End stage renal disease: Secondary | ICD-10-CM | POA: Diagnosis not present

## 2016-11-30 DIAGNOSIS — N2581 Secondary hyperparathyroidism of renal origin: Secondary | ICD-10-CM | POA: Diagnosis not present

## 2016-12-01 DIAGNOSIS — Z7901 Long term (current) use of anticoagulants: Secondary | ICD-10-CM | POA: Diagnosis not present

## 2016-12-01 DIAGNOSIS — I4891 Unspecified atrial fibrillation: Secondary | ICD-10-CM | POA: Diagnosis not present

## 2016-12-01 DIAGNOSIS — K219 Gastro-esophageal reflux disease without esophagitis: Secondary | ICD-10-CM | POA: Diagnosis not present

## 2016-12-01 DIAGNOSIS — F319 Bipolar disorder, unspecified: Secondary | ICD-10-CM | POA: Diagnosis not present

## 2016-12-01 DIAGNOSIS — I2510 Atherosclerotic heart disease of native coronary artery without angina pectoris: Secondary | ICD-10-CM | POA: Diagnosis not present

## 2016-12-01 DIAGNOSIS — K2270 Barrett's esophagus without dysplasia: Secondary | ICD-10-CM | POA: Diagnosis not present

## 2016-12-01 DIAGNOSIS — D631 Anemia in chronic kidney disease: Secondary | ICD-10-CM | POA: Diagnosis not present

## 2016-12-01 DIAGNOSIS — G4730 Sleep apnea, unspecified: Secondary | ICD-10-CM | POA: Diagnosis not present

## 2016-12-01 DIAGNOSIS — N400 Enlarged prostate without lower urinary tract symptoms: Secondary | ICD-10-CM | POA: Diagnosis not present

## 2016-12-03 DIAGNOSIS — D631 Anemia in chronic kidney disease: Secondary | ICD-10-CM | POA: Diagnosis not present

## 2016-12-03 DIAGNOSIS — N2581 Secondary hyperparathyroidism of renal origin: Secondary | ICD-10-CM | POA: Diagnosis not present

## 2016-12-03 DIAGNOSIS — N186 End stage renal disease: Secondary | ICD-10-CM | POA: Diagnosis not present

## 2016-12-06 DIAGNOSIS — D631 Anemia in chronic kidney disease: Secondary | ICD-10-CM | POA: Diagnosis not present

## 2016-12-06 DIAGNOSIS — N186 End stage renal disease: Secondary | ICD-10-CM | POA: Diagnosis not present

## 2016-12-06 DIAGNOSIS — N2581 Secondary hyperparathyroidism of renal origin: Secondary | ICD-10-CM | POA: Diagnosis not present

## 2016-12-08 DIAGNOSIS — I2510 Atherosclerotic heart disease of native coronary artery without angina pectoris: Secondary | ICD-10-CM | POA: Diagnosis not present

## 2016-12-08 DIAGNOSIS — N186 End stage renal disease: Secondary | ICD-10-CM | POA: Diagnosis not present

## 2016-12-08 DIAGNOSIS — I120 Hypertensive chronic kidney disease with stage 5 chronic kidney disease or end stage renal disease: Secondary | ICD-10-CM | POA: Diagnosis not present

## 2016-12-08 DIAGNOSIS — Z01818 Encounter for other preprocedural examination: Secondary | ICD-10-CM | POA: Diagnosis not present

## 2016-12-08 DIAGNOSIS — T85611A Breakdown (mechanical) of intraperitoneal dialysis catheter, initial encounter: Secondary | ICD-10-CM | POA: Diagnosis not present

## 2016-12-08 DIAGNOSIS — I4891 Unspecified atrial fibrillation: Secondary | ICD-10-CM | POA: Diagnosis not present

## 2016-12-08 DIAGNOSIS — Z9989 Dependence on other enabling machines and devices: Secondary | ICD-10-CM | POA: Diagnosis not present

## 2016-12-08 DIAGNOSIS — I4510 Unspecified right bundle-branch block: Secondary | ICD-10-CM | POA: Diagnosis not present

## 2016-12-08 DIAGNOSIS — E039 Hypothyroidism, unspecified: Secondary | ICD-10-CM | POA: Diagnosis not present

## 2016-12-08 DIAGNOSIS — G4733 Obstructive sleep apnea (adult) (pediatric): Secondary | ICD-10-CM | POA: Diagnosis not present

## 2016-12-08 DIAGNOSIS — Z992 Dependence on renal dialysis: Secondary | ICD-10-CM | POA: Diagnosis not present

## 2016-12-08 DIAGNOSIS — K219 Gastro-esophageal reflux disease without esophagitis: Secondary | ICD-10-CM | POA: Diagnosis not present

## 2016-12-08 DIAGNOSIS — F319 Bipolar disorder, unspecified: Secondary | ICD-10-CM | POA: Diagnosis not present

## 2016-12-08 DIAGNOSIS — T85691A Other mechanical complication of intraperitoneal dialysis catheter, initial encounter: Secondary | ICD-10-CM | POA: Diagnosis not present

## 2016-12-09 DIAGNOSIS — N186 End stage renal disease: Secondary | ICD-10-CM | POA: Diagnosis not present

## 2016-12-09 DIAGNOSIS — D631 Anemia in chronic kidney disease: Secondary | ICD-10-CM | POA: Diagnosis not present

## 2016-12-09 DIAGNOSIS — Z23 Encounter for immunization: Secondary | ICD-10-CM | POA: Diagnosis not present

## 2016-12-09 DIAGNOSIS — I4891 Unspecified atrial fibrillation: Secondary | ICD-10-CM | POA: Diagnosis not present

## 2016-12-09 DIAGNOSIS — N2581 Secondary hyperparathyroidism of renal origin: Secondary | ICD-10-CM | POA: Diagnosis not present

## 2016-12-11 DIAGNOSIS — N2581 Secondary hyperparathyroidism of renal origin: Secondary | ICD-10-CM | POA: Diagnosis not present

## 2016-12-11 DIAGNOSIS — Z23 Encounter for immunization: Secondary | ICD-10-CM | POA: Diagnosis not present

## 2016-12-11 DIAGNOSIS — D631 Anemia in chronic kidney disease: Secondary | ICD-10-CM | POA: Diagnosis not present

## 2016-12-11 DIAGNOSIS — N186 End stage renal disease: Secondary | ICD-10-CM | POA: Diagnosis not present

## 2016-12-14 DIAGNOSIS — I4891 Unspecified atrial fibrillation: Secondary | ICD-10-CM | POA: Diagnosis not present

## 2016-12-14 DIAGNOSIS — D631 Anemia in chronic kidney disease: Secondary | ICD-10-CM | POA: Diagnosis not present

## 2016-12-14 DIAGNOSIS — N186 End stage renal disease: Secondary | ICD-10-CM | POA: Diagnosis not present

## 2016-12-14 DIAGNOSIS — Z23 Encounter for immunization: Secondary | ICD-10-CM | POA: Diagnosis not present

## 2016-12-14 DIAGNOSIS — N2581 Secondary hyperparathyroidism of renal origin: Secondary | ICD-10-CM | POA: Diagnosis not present

## 2016-12-15 DIAGNOSIS — D631 Anemia in chronic kidney disease: Secondary | ICD-10-CM | POA: Diagnosis not present

## 2016-12-15 DIAGNOSIS — N186 End stage renal disease: Secondary | ICD-10-CM | POA: Diagnosis not present

## 2016-12-15 DIAGNOSIS — N2581 Secondary hyperparathyroidism of renal origin: Secondary | ICD-10-CM | POA: Diagnosis not present

## 2016-12-15 DIAGNOSIS — Z23 Encounter for immunization: Secondary | ICD-10-CM | POA: Diagnosis not present

## 2016-12-16 DIAGNOSIS — T85611A Breakdown (mechanical) of intraperitoneal dialysis catheter, initial encounter: Secondary | ICD-10-CM | POA: Diagnosis not present

## 2016-12-16 DIAGNOSIS — K219 Gastro-esophageal reflux disease without esophagitis: Secondary | ICD-10-CM | POA: Diagnosis not present

## 2016-12-16 DIAGNOSIS — I120 Hypertensive chronic kidney disease with stage 5 chronic kidney disease or end stage renal disease: Secondary | ICD-10-CM | POA: Diagnosis not present

## 2016-12-16 DIAGNOSIS — Z4902 Encounter for fitting and adjustment of peritoneal dialysis catheter: Secondary | ICD-10-CM | POA: Diagnosis not present

## 2016-12-16 DIAGNOSIS — I2510 Atherosclerotic heart disease of native coronary artery without angina pectoris: Secondary | ICD-10-CM | POA: Diagnosis not present

## 2016-12-16 DIAGNOSIS — F319 Bipolar disorder, unspecified: Secondary | ICD-10-CM | POA: Diagnosis not present

## 2016-12-16 DIAGNOSIS — N186 End stage renal disease: Secondary | ICD-10-CM | POA: Diagnosis not present

## 2016-12-16 DIAGNOSIS — I4510 Unspecified right bundle-branch block: Secondary | ICD-10-CM | POA: Diagnosis not present

## 2016-12-16 DIAGNOSIS — E039 Hypothyroidism, unspecified: Secondary | ICD-10-CM | POA: Diagnosis not present

## 2016-12-16 DIAGNOSIS — G4733 Obstructive sleep apnea (adult) (pediatric): Secondary | ICD-10-CM | POA: Diagnosis not present

## 2016-12-18 DIAGNOSIS — Z23 Encounter for immunization: Secondary | ICD-10-CM | POA: Diagnosis not present

## 2016-12-18 DIAGNOSIS — N186 End stage renal disease: Secondary | ICD-10-CM | POA: Diagnosis not present

## 2016-12-18 DIAGNOSIS — Z992 Dependence on renal dialysis: Secondary | ICD-10-CM | POA: Diagnosis not present

## 2016-12-18 DIAGNOSIS — N2581 Secondary hyperparathyroidism of renal origin: Secondary | ICD-10-CM | POA: Diagnosis not present

## 2016-12-18 DIAGNOSIS — D631 Anemia in chronic kidney disease: Secondary | ICD-10-CM | POA: Diagnosis not present

## 2016-12-21 DIAGNOSIS — N186 End stage renal disease: Secondary | ICD-10-CM | POA: Diagnosis not present

## 2016-12-21 DIAGNOSIS — Z7901 Long term (current) use of anticoagulants: Secondary | ICD-10-CM | POA: Diagnosis not present

## 2016-12-21 DIAGNOSIS — D509 Iron deficiency anemia, unspecified: Secondary | ICD-10-CM | POA: Diagnosis not present

## 2016-12-21 DIAGNOSIS — N2581 Secondary hyperparathyroidism of renal origin: Secondary | ICD-10-CM | POA: Diagnosis not present

## 2016-12-23 DIAGNOSIS — N2581 Secondary hyperparathyroidism of renal origin: Secondary | ICD-10-CM | POA: Diagnosis not present

## 2016-12-23 DIAGNOSIS — D509 Iron deficiency anemia, unspecified: Secondary | ICD-10-CM | POA: Diagnosis not present

## 2016-12-23 DIAGNOSIS — N186 End stage renal disease: Secondary | ICD-10-CM | POA: Diagnosis not present

## 2016-12-25 DIAGNOSIS — N2581 Secondary hyperparathyroidism of renal origin: Secondary | ICD-10-CM | POA: Diagnosis not present

## 2016-12-25 DIAGNOSIS — D509 Iron deficiency anemia, unspecified: Secondary | ICD-10-CM | POA: Diagnosis not present

## 2016-12-25 DIAGNOSIS — N186 End stage renal disease: Secondary | ICD-10-CM | POA: Diagnosis not present

## 2016-12-28 DIAGNOSIS — N186 End stage renal disease: Secondary | ICD-10-CM | POA: Diagnosis not present

## 2016-12-28 DIAGNOSIS — Z7901 Long term (current) use of anticoagulants: Secondary | ICD-10-CM | POA: Diagnosis not present

## 2016-12-28 DIAGNOSIS — D509 Iron deficiency anemia, unspecified: Secondary | ICD-10-CM | POA: Diagnosis not present

## 2016-12-28 DIAGNOSIS — N2581 Secondary hyperparathyroidism of renal origin: Secondary | ICD-10-CM | POA: Diagnosis not present

## 2016-12-29 DIAGNOSIS — I10 Essential (primary) hypertension: Secondary | ICD-10-CM | POA: Diagnosis not present

## 2016-12-29 DIAGNOSIS — S0181XA Laceration without foreign body of other part of head, initial encounter: Secondary | ICD-10-CM | POA: Diagnosis not present

## 2016-12-29 DIAGNOSIS — N184 Chronic kidney disease, stage 4 (severe): Secondary | ICD-10-CM | POA: Diagnosis not present

## 2016-12-29 DIAGNOSIS — I959 Hypotension, unspecified: Secondary | ICD-10-CM | POA: Diagnosis not present

## 2016-12-29 DIAGNOSIS — I517 Cardiomegaly: Secondary | ICD-10-CM | POA: Diagnosis not present

## 2016-12-29 DIAGNOSIS — F319 Bipolar disorder, unspecified: Secondary | ICD-10-CM | POA: Diagnosis not present

## 2016-12-29 DIAGNOSIS — E785 Hyperlipidemia, unspecified: Secondary | ICD-10-CM | POA: Diagnosis not present

## 2016-12-29 DIAGNOSIS — I482 Chronic atrial fibrillation: Secondary | ICD-10-CM | POA: Diagnosis not present

## 2016-12-29 DIAGNOSIS — I480 Paroxysmal atrial fibrillation: Secondary | ICD-10-CM | POA: Diagnosis not present

## 2016-12-29 DIAGNOSIS — S40021S Contusion of right upper arm, sequela: Secondary | ICD-10-CM | POA: Diagnosis not present

## 2016-12-29 DIAGNOSIS — S40022S Contusion of left upper arm, sequela: Secondary | ICD-10-CM | POA: Diagnosis not present

## 2016-12-29 DIAGNOSIS — T82898A Other specified complication of vascular prosthetic devices, implants and grafts, initial encounter: Secondary | ICD-10-CM | POA: Diagnosis not present

## 2016-12-29 DIAGNOSIS — G4700 Insomnia, unspecified: Secondary | ICD-10-CM | POA: Diagnosis not present

## 2016-12-29 DIAGNOSIS — D631 Anemia in chronic kidney disease: Secondary | ICD-10-CM | POA: Diagnosis not present

## 2016-12-29 DIAGNOSIS — I770 Arteriovenous fistula, acquired: Secondary | ICD-10-CM | POA: Diagnosis not present

## 2016-12-29 DIAGNOSIS — I4892 Unspecified atrial flutter: Secondary | ICD-10-CM | POA: Diagnosis not present

## 2016-12-29 DIAGNOSIS — L7632 Postprocedural hematoma of skin and subcutaneous tissue following other procedure: Secondary | ICD-10-CM | POA: Diagnosis not present

## 2016-12-29 DIAGNOSIS — R55 Syncope and collapse: Secondary | ICD-10-CM | POA: Diagnosis not present

## 2016-12-29 DIAGNOSIS — E039 Hypothyroidism, unspecified: Secondary | ICD-10-CM | POA: Diagnosis not present

## 2016-12-29 DIAGNOSIS — N189 Chronic kidney disease, unspecified: Secondary | ICD-10-CM | POA: Diagnosis not present

## 2016-12-29 DIAGNOSIS — Z992 Dependence on renal dialysis: Secondary | ICD-10-CM | POA: Diagnosis not present

## 2016-12-29 DIAGNOSIS — N186 End stage renal disease: Secondary | ICD-10-CM | POA: Diagnosis not present

## 2016-12-29 DIAGNOSIS — I4891 Unspecified atrial fibrillation: Secondary | ICD-10-CM | POA: Diagnosis not present

## 2016-12-29 DIAGNOSIS — R791 Abnormal coagulation profile: Secondary | ICD-10-CM | POA: Diagnosis not present

## 2016-12-29 DIAGNOSIS — T82898D Other specified complication of vascular prosthetic devices, implants and grafts, subsequent encounter: Secondary | ICD-10-CM | POA: Diagnosis not present

## 2016-12-29 DIAGNOSIS — M7981 Nontraumatic hematoma of soft tissue: Secondary | ICD-10-CM | POA: Diagnosis not present

## 2016-12-29 DIAGNOSIS — R7989 Other specified abnormal findings of blood chemistry: Secondary | ICD-10-CM | POA: Diagnosis not present

## 2016-12-29 DIAGNOSIS — Z7901 Long term (current) use of anticoagulants: Secondary | ICD-10-CM | POA: Diagnosis not present

## 2016-12-29 DIAGNOSIS — N400 Enlarged prostate without lower urinary tract symptoms: Secondary | ICD-10-CM | POA: Diagnosis not present

## 2016-12-29 DIAGNOSIS — I472 Ventricular tachycardia: Secondary | ICD-10-CM | POA: Diagnosis not present

## 2016-12-29 DIAGNOSIS — D62 Acute posthemorrhagic anemia: Secondary | ICD-10-CM | POA: Diagnosis not present

## 2016-12-29 DIAGNOSIS — I4510 Unspecified right bundle-branch block: Secondary | ICD-10-CM | POA: Diagnosis not present

## 2016-12-29 DIAGNOSIS — Z043 Encounter for examination and observation following other accident: Secondary | ICD-10-CM | POA: Diagnosis not present

## 2016-12-29 DIAGNOSIS — I728 Aneurysm of other specified arteries: Secondary | ICD-10-CM | POA: Diagnosis not present

## 2016-12-29 DIAGNOSIS — S022XXA Fracture of nasal bones, initial encounter for closed fracture: Secondary | ICD-10-CM | POA: Diagnosis not present

## 2016-12-29 DIAGNOSIS — Z955 Presence of coronary angioplasty implant and graft: Secondary | ICD-10-CM | POA: Diagnosis not present

## 2016-12-29 DIAGNOSIS — N2889 Other specified disorders of kidney and ureter: Secondary | ICD-10-CM | POA: Diagnosis not present

## 2016-12-29 DIAGNOSIS — M7989 Other specified soft tissue disorders: Secondary | ICD-10-CM | POA: Diagnosis not present

## 2016-12-29 DIAGNOSIS — S5012XA Contusion of left forearm, initial encounter: Secondary | ICD-10-CM | POA: Diagnosis not present

## 2016-12-29 DIAGNOSIS — I120 Hypertensive chronic kidney disease with stage 5 chronic kidney disease or end stage renal disease: Secondary | ICD-10-CM | POA: Diagnosis not present

## 2016-12-29 DIAGNOSIS — I248 Other forms of acute ischemic heart disease: Secondary | ICD-10-CM | POA: Diagnosis not present

## 2017-01-05 DIAGNOSIS — I517 Cardiomegaly: Secondary | ICD-10-CM | POA: Diagnosis not present

## 2017-01-05 DIAGNOSIS — R9431 Abnormal electrocardiogram [ECG] [EKG]: Secondary | ICD-10-CM | POA: Diagnosis not present

## 2017-01-05 DIAGNOSIS — I4510 Unspecified right bundle-branch block: Secondary | ICD-10-CM | POA: Diagnosis not present

## 2017-01-05 DIAGNOSIS — I444 Left anterior fascicular block: Secondary | ICD-10-CM | POA: Diagnosis not present

## 2017-01-06 DIAGNOSIS — N186 End stage renal disease: Secondary | ICD-10-CM | POA: Diagnosis not present

## 2017-01-06 DIAGNOSIS — D631 Anemia in chronic kidney disease: Secondary | ICD-10-CM | POA: Diagnosis not present

## 2017-01-06 DIAGNOSIS — Z7901 Long term (current) use of anticoagulants: Secondary | ICD-10-CM | POA: Diagnosis not present

## 2017-01-06 DIAGNOSIS — N2581 Secondary hyperparathyroidism of renal origin: Secondary | ICD-10-CM | POA: Diagnosis not present

## 2017-01-08 DIAGNOSIS — N2581 Secondary hyperparathyroidism of renal origin: Secondary | ICD-10-CM | POA: Diagnosis not present

## 2017-01-08 DIAGNOSIS — N186 End stage renal disease: Secondary | ICD-10-CM | POA: Diagnosis not present

## 2017-01-12 DIAGNOSIS — R079 Chest pain, unspecified: Secondary | ICD-10-CM | POA: Diagnosis not present

## 2017-01-12 DIAGNOSIS — I2510 Atherosclerotic heart disease of native coronary artery without angina pectoris: Secondary | ICD-10-CM | POA: Diagnosis not present

## 2017-01-12 DIAGNOSIS — I4891 Unspecified atrial fibrillation: Secondary | ICD-10-CM | POA: Diagnosis not present

## 2017-01-12 DIAGNOSIS — R109 Unspecified abdominal pain: Secondary | ICD-10-CM | POA: Diagnosis not present

## 2017-01-12 DIAGNOSIS — E785 Hyperlipidemia, unspecified: Secondary | ICD-10-CM | POA: Diagnosis not present

## 2017-01-12 DIAGNOSIS — I444 Left anterior fascicular block: Secondary | ICD-10-CM | POA: Diagnosis not present

## 2017-01-12 DIAGNOSIS — R0789 Other chest pain: Secondary | ICD-10-CM | POA: Diagnosis not present

## 2017-01-12 DIAGNOSIS — Z992 Dependence on renal dialysis: Secondary | ICD-10-CM | POA: Diagnosis not present

## 2017-01-12 DIAGNOSIS — N186 End stage renal disease: Secondary | ICD-10-CM | POA: Diagnosis not present

## 2017-01-12 DIAGNOSIS — I770 Arteriovenous fistula, acquired: Secondary | ICD-10-CM | POA: Diagnosis not present

## 2017-01-12 DIAGNOSIS — Z955 Presence of coronary angioplasty implant and graft: Secondary | ICD-10-CM | POA: Diagnosis not present

## 2017-01-12 DIAGNOSIS — R1013 Epigastric pain: Secondary | ICD-10-CM | POA: Diagnosis not present

## 2017-01-12 DIAGNOSIS — J984 Other disorders of lung: Secondary | ICD-10-CM | POA: Diagnosis not present

## 2017-01-12 DIAGNOSIS — I959 Hypotension, unspecified: Secondary | ICD-10-CM | POA: Diagnosis not present

## 2017-01-12 DIAGNOSIS — Z9119 Patient's noncompliance with other medical treatment and regimen: Secondary | ICD-10-CM | POA: Diagnosis not present

## 2017-01-12 DIAGNOSIS — I214 Non-ST elevation (NSTEMI) myocardial infarction: Secondary | ICD-10-CM | POA: Diagnosis not present

## 2017-01-12 DIAGNOSIS — D631 Anemia in chronic kidney disease: Secondary | ICD-10-CM | POA: Diagnosis not present

## 2017-01-12 DIAGNOSIS — R1030 Lower abdominal pain, unspecified: Secondary | ICD-10-CM | POA: Diagnosis not present

## 2017-01-12 DIAGNOSIS — Z7901 Long term (current) use of anticoagulants: Secondary | ICD-10-CM | POA: Diagnosis not present

## 2017-01-12 DIAGNOSIS — I120 Hypertensive chronic kidney disease with stage 5 chronic kidney disease or end stage renal disease: Secondary | ICD-10-CM | POA: Diagnosis not present

## 2017-01-12 DIAGNOSIS — Z4901 Encounter for fitting and adjustment of extracorporeal dialysis catheter: Secondary | ICD-10-CM | POA: Diagnosis not present

## 2017-01-12 DIAGNOSIS — E039 Hypothyroidism, unspecified: Secondary | ICD-10-CM | POA: Diagnosis not present

## 2017-01-12 DIAGNOSIS — K219 Gastro-esophageal reflux disease without esophagitis: Secondary | ICD-10-CM | POA: Diagnosis not present

## 2017-01-12 DIAGNOSIS — I452 Bifascicular block: Secondary | ICD-10-CM | POA: Diagnosis not present

## 2017-01-12 DIAGNOSIS — I482 Chronic atrial fibrillation: Secondary | ICD-10-CM | POA: Diagnosis not present

## 2017-01-12 DIAGNOSIS — T503X6A Underdosing of electrolytic, caloric and water-balance agents, initial encounter: Secondary | ICD-10-CM | POA: Diagnosis not present

## 2017-01-12 DIAGNOSIS — I4510 Unspecified right bundle-branch block: Secondary | ICD-10-CM | POA: Diagnosis not present

## 2017-01-12 DIAGNOSIS — I1311 Hypertensive heart and chronic kidney disease without heart failure, with stage 5 chronic kidney disease, or end stage renal disease: Secondary | ICD-10-CM | POA: Diagnosis not present

## 2017-01-12 DIAGNOSIS — I517 Cardiomegaly: Secondary | ICD-10-CM | POA: Diagnosis not present

## 2017-01-12 DIAGNOSIS — I493 Ventricular premature depolarization: Secondary | ICD-10-CM | POA: Diagnosis not present

## 2017-01-12 DIAGNOSIS — G4733 Obstructive sleep apnea (adult) (pediatric): Secondary | ICD-10-CM | POA: Diagnosis not present

## 2017-01-12 DIAGNOSIS — R531 Weakness: Secondary | ICD-10-CM | POA: Diagnosis not present

## 2017-01-17 DIAGNOSIS — Z992 Dependence on renal dialysis: Secondary | ICD-10-CM | POA: Diagnosis not present

## 2017-01-17 DIAGNOSIS — N2581 Secondary hyperparathyroidism of renal origin: Secondary | ICD-10-CM | POA: Diagnosis not present

## 2017-01-17 DIAGNOSIS — Z7901 Long term (current) use of anticoagulants: Secondary | ICD-10-CM | POA: Diagnosis not present

## 2017-01-17 DIAGNOSIS — N186 End stage renal disease: Secondary | ICD-10-CM | POA: Diagnosis not present

## 2017-01-19 DIAGNOSIS — N186 End stage renal disease: Secondary | ICD-10-CM | POA: Diagnosis not present

## 2017-01-19 DIAGNOSIS — D631 Anemia in chronic kidney disease: Secondary | ICD-10-CM | POA: Diagnosis not present

## 2017-01-19 DIAGNOSIS — N2581 Secondary hyperparathyroidism of renal origin: Secondary | ICD-10-CM | POA: Diagnosis not present

## 2017-01-19 DIAGNOSIS — D509 Iron deficiency anemia, unspecified: Secondary | ICD-10-CM | POA: Diagnosis not present

## 2017-01-24 DIAGNOSIS — D509 Iron deficiency anemia, unspecified: Secondary | ICD-10-CM | POA: Diagnosis not present

## 2017-01-24 DIAGNOSIS — N186 End stage renal disease: Secondary | ICD-10-CM | POA: Diagnosis not present

## 2017-01-24 DIAGNOSIS — N2581 Secondary hyperparathyroidism of renal origin: Secondary | ICD-10-CM | POA: Diagnosis not present

## 2017-01-24 DIAGNOSIS — D631 Anemia in chronic kidney disease: Secondary | ICD-10-CM | POA: Diagnosis not present

## 2017-01-26 DIAGNOSIS — N2581 Secondary hyperparathyroidism of renal origin: Secondary | ICD-10-CM | POA: Diagnosis not present

## 2017-01-26 DIAGNOSIS — D631 Anemia in chronic kidney disease: Secondary | ICD-10-CM | POA: Diagnosis not present

## 2017-01-26 DIAGNOSIS — Z7901 Long term (current) use of anticoagulants: Secondary | ICD-10-CM | POA: Diagnosis not present

## 2017-01-26 DIAGNOSIS — N186 End stage renal disease: Secondary | ICD-10-CM | POA: Diagnosis not present

## 2017-01-26 DIAGNOSIS — D509 Iron deficiency anemia, unspecified: Secondary | ICD-10-CM | POA: Diagnosis not present

## 2017-01-27 DIAGNOSIS — Z95828 Presence of other vascular implants and grafts: Secondary | ICD-10-CM | POA: Diagnosis not present

## 2017-01-27 DIAGNOSIS — I770 Arteriovenous fistula, acquired: Secondary | ICD-10-CM | POA: Diagnosis not present

## 2017-01-31 DIAGNOSIS — Z23 Encounter for immunization: Secondary | ICD-10-CM | POA: Diagnosis not present

## 2017-01-31 DIAGNOSIS — N186 End stage renal disease: Secondary | ICD-10-CM | POA: Diagnosis not present

## 2017-01-31 DIAGNOSIS — N2581 Secondary hyperparathyroidism of renal origin: Secondary | ICD-10-CM | POA: Diagnosis not present

## 2017-01-31 DIAGNOSIS — Z7901 Long term (current) use of anticoagulants: Secondary | ICD-10-CM | POA: Diagnosis not present

## 2017-02-02 DIAGNOSIS — N2581 Secondary hyperparathyroidism of renal origin: Secondary | ICD-10-CM | POA: Diagnosis not present

## 2017-02-02 DIAGNOSIS — Z23 Encounter for immunization: Secondary | ICD-10-CM | POA: Diagnosis not present

## 2017-02-02 DIAGNOSIS — N186 End stage renal disease: Secondary | ICD-10-CM | POA: Diagnosis not present

## 2017-02-04 DIAGNOSIS — N186 End stage renal disease: Secondary | ICD-10-CM | POA: Diagnosis not present

## 2017-02-04 DIAGNOSIS — Z23 Encounter for immunization: Secondary | ICD-10-CM | POA: Diagnosis not present

## 2017-02-04 DIAGNOSIS — N2581 Secondary hyperparathyroidism of renal origin: Secondary | ICD-10-CM | POA: Diagnosis not present

## 2017-02-09 DIAGNOSIS — N2581 Secondary hyperparathyroidism of renal origin: Secondary | ICD-10-CM | POA: Diagnosis not present

## 2017-02-09 DIAGNOSIS — D631 Anemia in chronic kidney disease: Secondary | ICD-10-CM | POA: Diagnosis not present

## 2017-02-09 DIAGNOSIS — N186 End stage renal disease: Secondary | ICD-10-CM | POA: Diagnosis not present

## 2017-02-09 DIAGNOSIS — Z7901 Long term (current) use of anticoagulants: Secondary | ICD-10-CM | POA: Diagnosis not present

## 2017-02-11 DIAGNOSIS — D631 Anemia in chronic kidney disease: Secondary | ICD-10-CM | POA: Diagnosis not present

## 2017-02-11 DIAGNOSIS — N186 End stage renal disease: Secondary | ICD-10-CM | POA: Diagnosis not present

## 2017-02-11 DIAGNOSIS — Z5181 Encounter for therapeutic drug level monitoring: Secondary | ICD-10-CM | POA: Diagnosis not present

## 2017-02-11 DIAGNOSIS — N2581 Secondary hyperparathyroidism of renal origin: Secondary | ICD-10-CM | POA: Diagnosis not present

## 2017-02-11 DIAGNOSIS — Z7901 Long term (current) use of anticoagulants: Secondary | ICD-10-CM | POA: Diagnosis not present

## 2017-02-14 DIAGNOSIS — I4891 Unspecified atrial fibrillation: Secondary | ICD-10-CM | POA: Diagnosis not present

## 2017-02-14 DIAGNOSIS — E039 Hypothyroidism, unspecified: Secondary | ICD-10-CM | POA: Diagnosis not present

## 2017-02-14 DIAGNOSIS — R918 Other nonspecific abnormal finding of lung field: Secondary | ICD-10-CM | POA: Diagnosis not present

## 2017-02-14 DIAGNOSIS — J122 Parainfluenza virus pneumonia: Secondary | ICD-10-CM | POA: Diagnosis not present

## 2017-02-14 DIAGNOSIS — D696 Thrombocytopenia, unspecified: Secondary | ICD-10-CM | POA: Diagnosis not present

## 2017-02-14 DIAGNOSIS — J9811 Atelectasis: Secondary | ICD-10-CM | POA: Diagnosis not present

## 2017-02-14 DIAGNOSIS — E8770 Fluid overload, unspecified: Secondary | ICD-10-CM | POA: Diagnosis not present

## 2017-02-14 DIAGNOSIS — J90 Pleural effusion, not elsewhere classified: Secondary | ICD-10-CM | POA: Diagnosis not present

## 2017-02-14 DIAGNOSIS — E785 Hyperlipidemia, unspecified: Secondary | ICD-10-CM | POA: Diagnosis not present

## 2017-02-14 DIAGNOSIS — Z7901 Long term (current) use of anticoagulants: Secondary | ICD-10-CM | POA: Diagnosis not present

## 2017-02-14 DIAGNOSIS — I482 Chronic atrial fibrillation: Secondary | ICD-10-CM | POA: Diagnosis not present

## 2017-02-14 DIAGNOSIS — I132 Hypertensive heart and chronic kidney disease with heart failure and with stage 5 chronic kidney disease, or end stage renal disease: Secondary | ICD-10-CM | POA: Diagnosis not present

## 2017-02-14 DIAGNOSIS — R079 Chest pain, unspecified: Secondary | ICD-10-CM | POA: Diagnosis not present

## 2017-02-14 DIAGNOSIS — R000 Tachycardia, unspecified: Secondary | ICD-10-CM | POA: Diagnosis not present

## 2017-02-14 DIAGNOSIS — R062 Wheezing: Secondary | ICD-10-CM | POA: Diagnosis not present

## 2017-02-14 DIAGNOSIS — I483 Typical atrial flutter: Secondary | ICD-10-CM | POA: Diagnosis not present

## 2017-02-14 DIAGNOSIS — I2109 ST elevation (STEMI) myocardial infarction involving other coronary artery of anterior wall: Secondary | ICD-10-CM | POA: Diagnosis not present

## 2017-02-14 DIAGNOSIS — I4901 Ventricular fibrillation: Secondary | ICD-10-CM | POA: Diagnosis not present

## 2017-02-14 DIAGNOSIS — R9431 Abnormal electrocardiogram [ECG] [EKG]: Secondary | ICD-10-CM | POA: Diagnosis not present

## 2017-02-14 DIAGNOSIS — I248 Other forms of acute ischemic heart disease: Secondary | ICD-10-CM | POA: Diagnosis not present

## 2017-02-14 DIAGNOSIS — R05 Cough: Secondary | ICD-10-CM | POA: Diagnosis not present

## 2017-02-14 DIAGNOSIS — D631 Anemia in chronic kidney disease: Secondary | ICD-10-CM | POA: Diagnosis not present

## 2017-02-14 DIAGNOSIS — R791 Abnormal coagulation profile: Secondary | ICD-10-CM | POA: Diagnosis not present

## 2017-02-14 DIAGNOSIS — R0789 Other chest pain: Secondary | ICD-10-CM | POA: Diagnosis not present

## 2017-02-14 DIAGNOSIS — J150 Pneumonia due to Klebsiella pneumoniae: Secondary | ICD-10-CM | POA: Diagnosis not present

## 2017-02-14 DIAGNOSIS — J810 Acute pulmonary edema: Secondary | ICD-10-CM | POA: Diagnosis not present

## 2017-02-14 DIAGNOSIS — I120 Hypertensive chronic kidney disease with stage 5 chronic kidney disease or end stage renal disease: Secondary | ICD-10-CM | POA: Diagnosis not present

## 2017-02-14 DIAGNOSIS — I5022 Chronic systolic (congestive) heart failure: Secondary | ICD-10-CM | POA: Diagnosis not present

## 2017-02-14 DIAGNOSIS — J189 Pneumonia, unspecified organism: Secondary | ICD-10-CM | POA: Diagnosis not present

## 2017-02-14 DIAGNOSIS — N186 End stage renal disease: Secondary | ICD-10-CM | POA: Diagnosis not present

## 2017-02-14 DIAGNOSIS — E871 Hypo-osmolality and hyponatremia: Secondary | ICD-10-CM | POA: Diagnosis not present

## 2017-02-14 DIAGNOSIS — R0602 Shortness of breath: Secondary | ICD-10-CM | POA: Diagnosis not present

## 2017-02-14 DIAGNOSIS — J9601 Acute respiratory failure with hypoxia: Secondary | ICD-10-CM | POA: Diagnosis not present

## 2017-02-14 DIAGNOSIS — I2119 ST elevation (STEMI) myocardial infarction involving other coronary artery of inferior wall: Secondary | ICD-10-CM | POA: Diagnosis not present

## 2017-02-14 DIAGNOSIS — Z992 Dependence on renal dialysis: Secondary | ICD-10-CM | POA: Diagnosis not present

## 2017-02-14 IMAGING — DX DG CHEST 2V
2 series · 2 of 2 positions shown · non-contrast
Comparison: 10/04/2014

CLINICAL DATA: Weakness and shortness of Breath

EXAM:
CHEST  2 VIEW

[w chest pa]
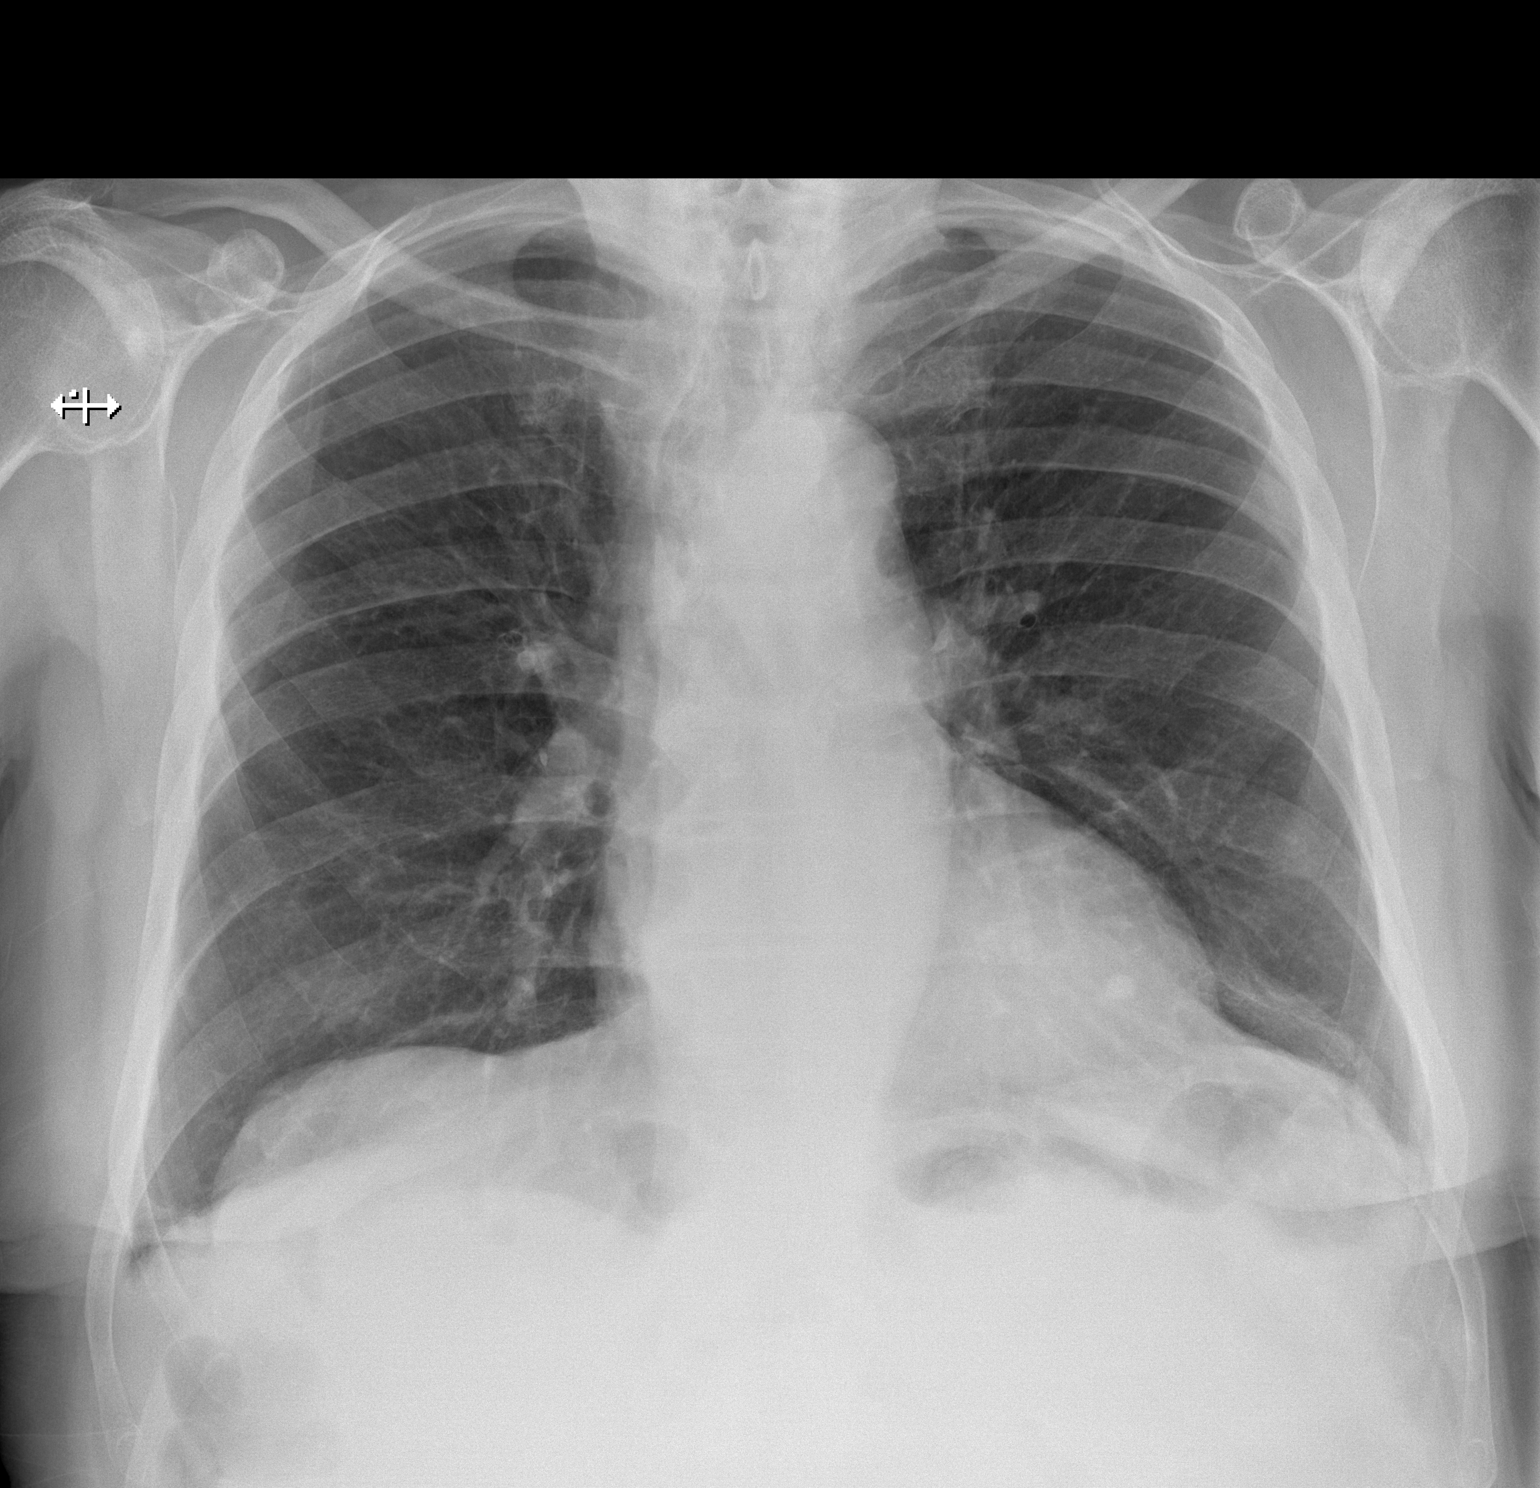

[w chest lat]
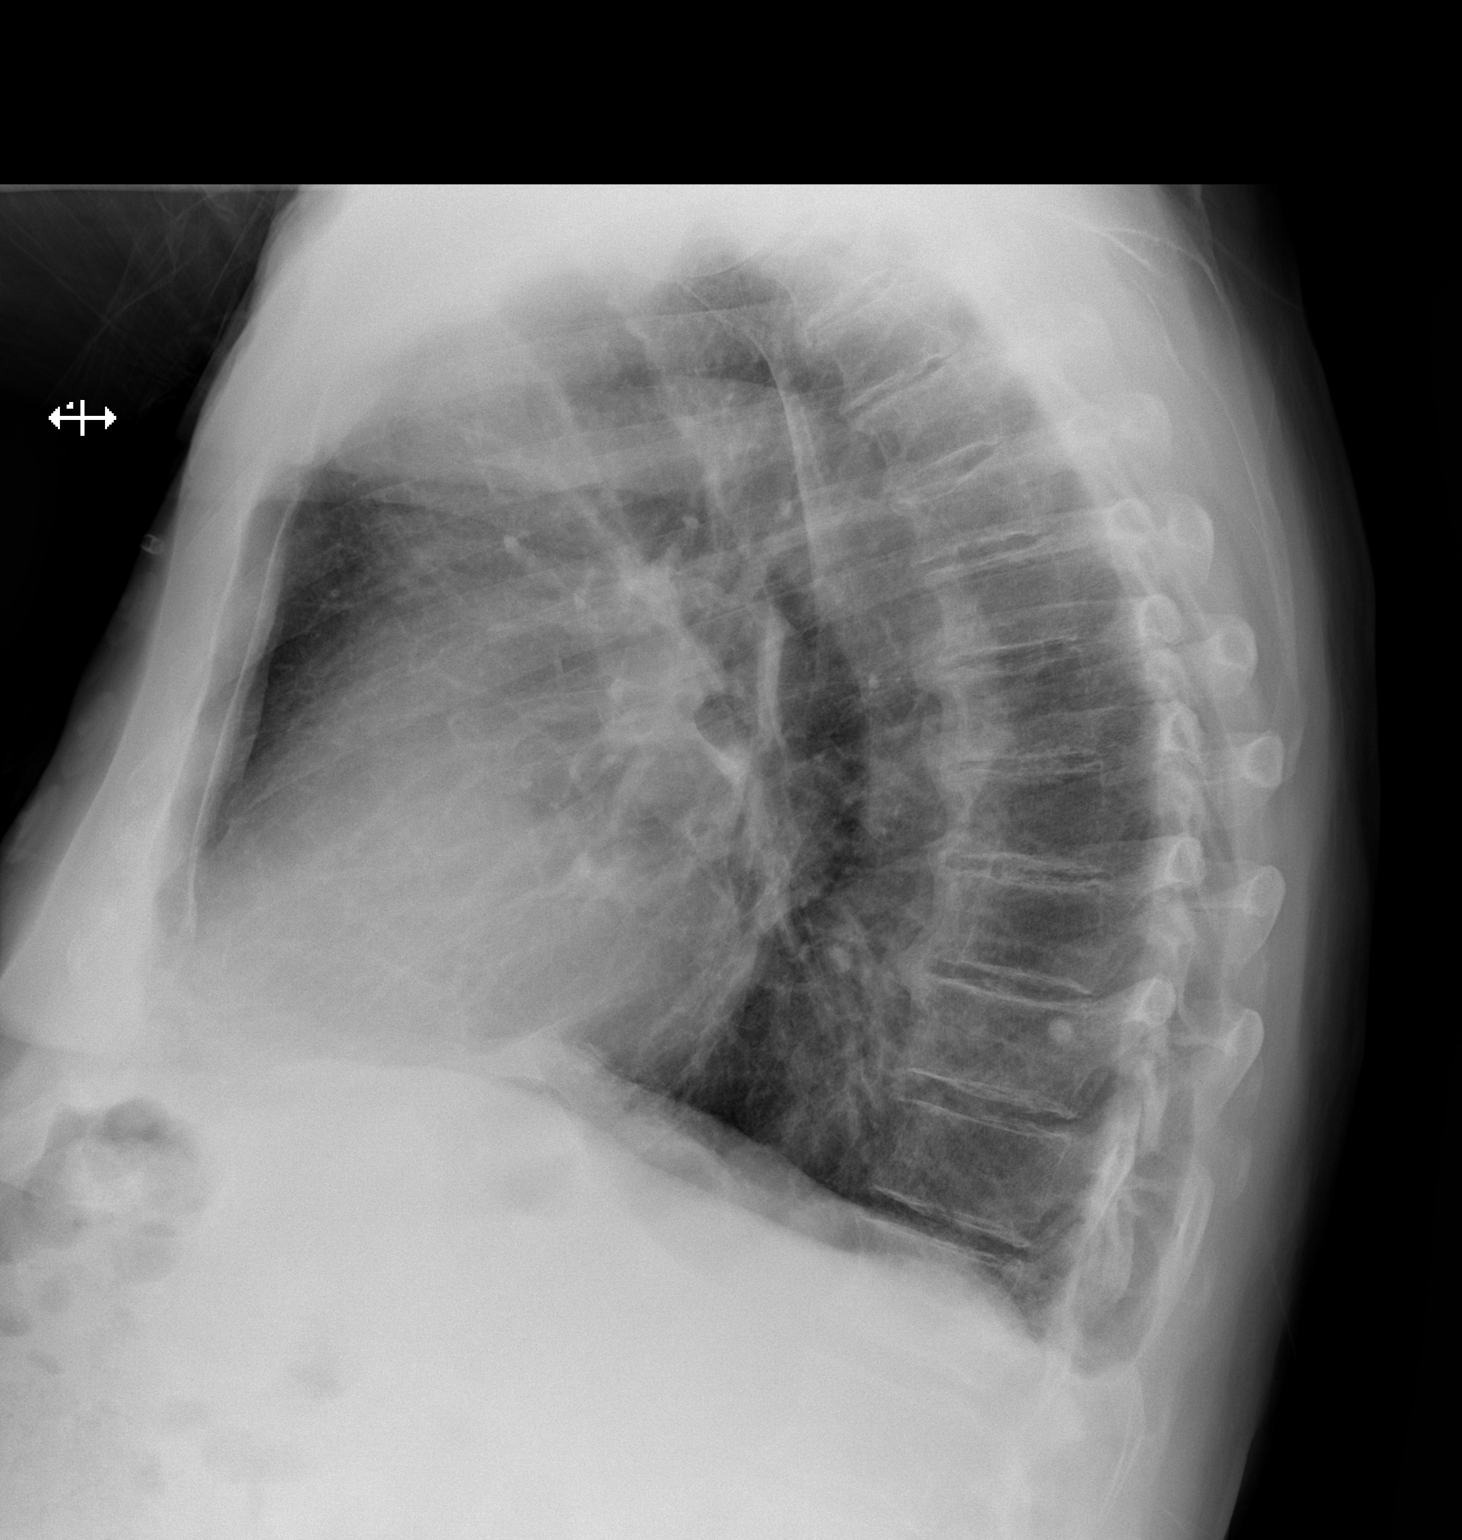

[2 of 2 positions shown; findings below may reference images not displayed]

FINDINGS: Cardiac shadow is stable. Calcified granuloma is noted in the left
lung base. The lungs are clear. No sizable effusion or CHF is noted.
No bony abnormality is seen
IMPRESSION: No acute abnormality noted.

## 2017-02-17 DIAGNOSIS — Z992 Dependence on renal dialysis: Secondary | ICD-10-CM | POA: Diagnosis not present

## 2017-02-17 DIAGNOSIS — N186 End stage renal disease: Secondary | ICD-10-CM | POA: Diagnosis not present

## 2017-02-18 DIAGNOSIS — N186 End stage renal disease: Secondary | ICD-10-CM | POA: Diagnosis not present

## 2017-02-18 DIAGNOSIS — Z992 Dependence on renal dialysis: Secondary | ICD-10-CM | POA: Diagnosis not present

## 2017-02-19 DIAGNOSIS — Z992 Dependence on renal dialysis: Secondary | ICD-10-CM | POA: Diagnosis not present

## 2017-02-19 DIAGNOSIS — N186 End stage renal disease: Secondary | ICD-10-CM | POA: Diagnosis not present

## 2017-02-20 DIAGNOSIS — Z992 Dependence on renal dialysis: Secondary | ICD-10-CM | POA: Diagnosis not present

## 2017-02-20 DIAGNOSIS — N186 End stage renal disease: Secondary | ICD-10-CM | POA: Diagnosis not present

## 2017-02-21 DIAGNOSIS — N186 End stage renal disease: Secondary | ICD-10-CM | POA: Diagnosis not present

## 2017-02-21 DIAGNOSIS — Z7901 Long term (current) use of anticoagulants: Secondary | ICD-10-CM | POA: Diagnosis not present

## 2017-02-21 DIAGNOSIS — Z992 Dependence on renal dialysis: Secondary | ICD-10-CM | POA: Diagnosis not present

## 2017-02-21 DIAGNOSIS — N2581 Secondary hyperparathyroidism of renal origin: Secondary | ICD-10-CM | POA: Diagnosis not present

## 2017-02-21 DIAGNOSIS — D631 Anemia in chronic kidney disease: Secondary | ICD-10-CM | POA: Diagnosis not present

## 2017-02-21 DIAGNOSIS — D509 Iron deficiency anemia, unspecified: Secondary | ICD-10-CM | POA: Diagnosis not present

## 2017-02-22 DIAGNOSIS — Z992 Dependence on renal dialysis: Secondary | ICD-10-CM | POA: Diagnosis not present

## 2017-02-22 DIAGNOSIS — N186 End stage renal disease: Secondary | ICD-10-CM | POA: Diagnosis not present

## 2017-02-23 DIAGNOSIS — N186 End stage renal disease: Secondary | ICD-10-CM | POA: Diagnosis not present

## 2017-02-23 DIAGNOSIS — Z992 Dependence on renal dialysis: Secondary | ICD-10-CM | POA: Diagnosis not present

## 2017-02-24 DIAGNOSIS — Z992 Dependence on renal dialysis: Secondary | ICD-10-CM | POA: Diagnosis not present

## 2017-02-24 DIAGNOSIS — N186 End stage renal disease: Secondary | ICD-10-CM | POA: Diagnosis not present

## 2017-02-25 DIAGNOSIS — D509 Iron deficiency anemia, unspecified: Secondary | ICD-10-CM | POA: Diagnosis not present

## 2017-02-25 DIAGNOSIS — D631 Anemia in chronic kidney disease: Secondary | ICD-10-CM | POA: Diagnosis not present

## 2017-02-25 DIAGNOSIS — N186 End stage renal disease: Secondary | ICD-10-CM | POA: Diagnosis not present

## 2017-02-25 DIAGNOSIS — N2581 Secondary hyperparathyroidism of renal origin: Secondary | ICD-10-CM | POA: Diagnosis not present

## 2017-02-25 DIAGNOSIS — Z992 Dependence on renal dialysis: Secondary | ICD-10-CM | POA: Diagnosis not present

## 2017-02-26 DIAGNOSIS — N186 End stage renal disease: Secondary | ICD-10-CM | POA: Diagnosis not present

## 2017-02-26 DIAGNOSIS — Z992 Dependence on renal dialysis: Secondary | ICD-10-CM | POA: Diagnosis not present

## 2017-02-27 DIAGNOSIS — N186 End stage renal disease: Secondary | ICD-10-CM | POA: Diagnosis not present

## 2017-02-27 DIAGNOSIS — Z992 Dependence on renal dialysis: Secondary | ICD-10-CM | POA: Diagnosis not present

## 2017-02-28 DIAGNOSIS — Z992 Dependence on renal dialysis: Secondary | ICD-10-CM | POA: Diagnosis not present

## 2017-02-28 DIAGNOSIS — N186 End stage renal disease: Secondary | ICD-10-CM | POA: Diagnosis not present

## 2017-03-20 DIAGNOSIS — 419620001 Death: Secondary | SNOMED CT | POA: Diagnosis not present

## 2017-03-20 DEATH — deceased
